# Patient Record
Sex: Male | Born: 1955 | Race: White | Hispanic: No | Marital: Married | Smoking: Never smoker
Health system: Southern US, Community
[De-identification: ages and names within clinical notes are randomized; demographics above are authoritative.]

## PROBLEM LIST (undated history)

## (undated) DIAGNOSIS — R0600 Dyspnea, unspecified: Secondary | ICD-10-CM

## (undated) DIAGNOSIS — M1990 Unspecified osteoarthritis, unspecified site: Secondary | ICD-10-CM

## (undated) DIAGNOSIS — R42 Dizziness and giddiness: Secondary | ICD-10-CM

## (undated) DIAGNOSIS — F419 Anxiety disorder, unspecified: Secondary | ICD-10-CM

## (undated) DIAGNOSIS — G20A1 Parkinson's disease without dyskinesia, without mention of fluctuations: Secondary | ICD-10-CM

## (undated) DIAGNOSIS — K219 Gastro-esophageal reflux disease without esophagitis: Secondary | ICD-10-CM

## (undated) DIAGNOSIS — E7800 Pure hypercholesterolemia, unspecified: Secondary | ICD-10-CM

## (undated) DIAGNOSIS — I10 Essential (primary) hypertension: Secondary | ICD-10-CM

## (undated) HISTORY — DX: Dizziness and giddiness: R42

## (undated) HISTORY — PX: SHOULDER SURGERY: SHX246

## (undated) HISTORY — DX: Essential (primary) hypertension: I10

## (undated) HISTORY — DX: Parkinson's disease without dyskinesia, without mention of fluctuations: G20.A1

## (undated) HISTORY — DX: Anxiety disorder, unspecified: F41.9

## (undated) HISTORY — PX: HERNIA REPAIR: SHX51

## (undated) HISTORY — DX: Pure hypercholesterolemia, unspecified: E78.00

## (undated) HISTORY — DX: Unspecified osteoarthritis, unspecified site: M19.90

---

## 2012-05-15 DIAGNOSIS — F410 Panic disorder [episodic paroxysmal anxiety] without agoraphobia: Secondary | ICD-10-CM | POA: Insufficient documentation

## 2012-05-15 DIAGNOSIS — G4733 Obstructive sleep apnea (adult) (pediatric): Secondary | ICD-10-CM | POA: Insufficient documentation

## 2012-05-15 DIAGNOSIS — E785 Hyperlipidemia, unspecified: Secondary | ICD-10-CM | POA: Insufficient documentation

## 2012-05-15 DIAGNOSIS — E291 Testicular hypofunction: Secondary | ICD-10-CM | POA: Insufficient documentation

## 2012-05-15 DIAGNOSIS — F419 Anxiety disorder, unspecified: Secondary | ICD-10-CM | POA: Insufficient documentation

## 2012-05-15 DIAGNOSIS — G4700 Insomnia, unspecified: Secondary | ICD-10-CM | POA: Insufficient documentation

## 2012-05-15 DIAGNOSIS — Z9889 Other specified postprocedural states: Secondary | ICD-10-CM | POA: Insufficient documentation

## 2017-01-14 HISTORY — PX: BACK SURGERY: SHX140

## 2018-09-01 DIAGNOSIS — E039 Hypothyroidism, unspecified: Secondary | ICD-10-CM | POA: Insufficient documentation

## 2018-09-01 DIAGNOSIS — G8929 Other chronic pain: Secondary | ICD-10-CM | POA: Insufficient documentation

## 2019-12-24 ENCOUNTER — Other Ambulatory Visit (HOSPITAL_COMMUNITY): Payer: Self-pay | Admitting: Neurosurgery

## 2019-12-24 DIAGNOSIS — M4712 Other spondylosis with myelopathy, cervical region: Secondary | ICD-10-CM

## 2020-01-06 ENCOUNTER — Other Ambulatory Visit: Payer: Self-pay

## 2020-01-06 ENCOUNTER — Ambulatory Visit (HOSPITAL_COMMUNITY)
Admission: RE | Admit: 2020-01-06 | Discharge: 2020-01-06 | Disposition: A | Discharge status: Home / Self Care | Payer: Medicare Other | Source: Ambulatory Visit | Attending: Neurosurgery | Admitting: Neurosurgery

## 2020-01-06 DIAGNOSIS — M4712 Other spondylosis with myelopathy, cervical region: Secondary | ICD-10-CM

## 2020-01-06 DIAGNOSIS — M4722 Other spondylosis with radiculopathy, cervical region: Secondary | ICD-10-CM | POA: Diagnosis present

## 2020-01-15 HISTORY — PX: NECK SURGERY: SHX720

## 2020-11-23 ENCOUNTER — Other Ambulatory Visit (HOSPITAL_COMMUNITY): Payer: Self-pay | Admitting: Neurology

## 2020-11-23 ENCOUNTER — Other Ambulatory Visit: Payer: Self-pay | Admitting: Neurology

## 2020-11-23 DIAGNOSIS — R296 Repeated falls: Secondary | ICD-10-CM

## 2020-11-23 DIAGNOSIS — G903 Multi-system degeneration of the autonomic nervous system: Secondary | ICD-10-CM

## 2020-12-06 ENCOUNTER — Ambulatory Visit (HOSPITAL_COMMUNITY)
Admission: RE | Admit: 2020-12-06 | Discharge: 2020-12-06 | Disposition: A | Discharge status: Home / Self Care | Payer: Medicare Other | Source: Ambulatory Visit | Attending: Neurology | Admitting: Neurology

## 2020-12-06 ENCOUNTER — Other Ambulatory Visit: Payer: Self-pay

## 2020-12-06 DIAGNOSIS — R296 Repeated falls: Secondary | ICD-10-CM

## 2020-12-06 DIAGNOSIS — G903 Multi-system degeneration of the autonomic nervous system: Secondary | ICD-10-CM | POA: Diagnosis not present

## 2021-02-05 ENCOUNTER — Other Ambulatory Visit: Payer: Self-pay

## 2021-02-05 ENCOUNTER — Encounter: Payer: Self-pay | Admitting: Urology

## 2021-02-05 ENCOUNTER — Ambulatory Visit (INDEPENDENT_AMBULATORY_CARE_PROVIDER_SITE_OTHER): Payer: Medicare Other | Admitting: Urology

## 2021-02-05 VITALS — BP 125/81 | HR 60 | Ht 69.0 in | Wt 240.8 lb

## 2021-02-05 DIAGNOSIS — N529 Male erectile dysfunction, unspecified: Secondary | ICD-10-CM | POA: Diagnosis not present

## 2021-02-05 MED ORDER — TADALAFIL 5 MG PO TABS: 5.0000 mg | ORAL_TABLET | Freq: Every day | ORAL | 11 refills | Status: DC

## 2021-02-05 MED ORDER — TADALAFIL 20 MG PO TABS: 20.0000 mg | ORAL_TABLET | Freq: Every day | ORAL | 5 refills | Status: DC | PRN

## 2021-02-05 NOTE — Progress Notes (Signed)
02/05/2021 1:20 PM   Alex [name] 18-Jun-1955 [id number]  Referring provider: Elfredia Nevins, MD [address] [place],  [place] [address]  Erectile dysfunction   HPI: Alex [name] is a 17EX here for evaluation of erectile dysfunction. He previously tried sildenafil 100mg  which failed to give him a firm erection. He cannot get a firm erection and cannot maintain an erection. Good libido. He has Parkinsons. He is on TRT and takes 400mg  IM every 2 weeks. Testosterone 500. IPSS 24 QOL 3. Nocturia 1x. He has an intermittent weak stream. PSA 1.6.    PMH: Past Medical History:  Diagnosis Date   Anxiety    Arthritis    High cholesterol    Hypertension     Surgical History:   Home Medications:  Allergies as of 02/05/2021       Reactions   Oxycodone         Medication List        Accurate as of February 05, 2021  1:20 PM. If you have any questions, ask your nurse or doctor.          amantadine 100 MG capsule Commonly known as: SYMMETREL Take 100 mg by mouth 2 (two) times daily.   atorvastatin 40 MG tablet Commonly known as: LIPITOR Take 40 mg by mouth daily.   carbidopa-levodopa 25-100 MG tablet Commonly known as: SINEMET IR Take 1 tablet by mouth 4 (four) times daily.   citalopram 40 MG tablet Commonly known as: CELEXA Take by mouth.   folic acid 1 MG tablet Commonly known as: FOLVITE folic acid 1 mg tablet   gabapentin 300 MG capsule Commonly known as: NEURONTIN Take 300 mg by mouth daily.   Levothyroxine Sodium 25 MCG Caps Take by mouth.   metoprolol succinate 50 MG 24 hr tablet Commonly known as: TOPROL-XL metoprolol succinate ER 50 mg tablet,extended release 24 hr        Allergies:  Allergies  Allergen Reactions   Oxycodone     Family History: No family history on file.  Social History:  has no history on file for tobacco use, alcohol use, and drug use.  ROS: All other review of systems were reviewed and are negative  except what is noted above in HPI  Physical Exam: BP 125/81    Pulse 60    Ht 5\' 9"  (1.753 m)    Wt 240 lb 12.8 oz (109.2 kg)    BMI 35.56 kg/m   Constitutional:  Alert and oriented, No acute distress. HEENT: [place] AT, moist mucus membranes.  Trachea midline, no masses. Cardiovascular: No clubbing, cyanosis, or edema. Respiratory: Normal respiratory effort, no increased work of breathing. GI: Abdomen is soft, nontender, nondistended, no abdominal masses GU: No CVA tenderness.   Lymph: No cervical or inguinal lymphadenopathy. Skin: No rashes, bruises or suspicious lesions. Neurologic: Grossly intact, no focal deficits, moving all 4 extremities. Psychiatric: Normal mood and affect.  Laboratory Data: No results found for: WBC, HGB, HCT, MCV, PLT  No results found for: CREATININE  No results found for: PSA  No results found for: TESTOSTERONE  No results found for: HGBA1C  Urinalysis No results found for: COLORURINE, APPEARANCEUR, LABSPEC, PHURINE, GLUCOSEU, HGBUR, BILIRUBINUR, KETONESUR, PROTEINUR, UROBILINOGEN, NITRITE, LEUKOCYTESUR  No results found for: LABMICR, WBCUA, RBCUA, LABEPIT, MUCUS, BACTERIA  Pertinent Imaging:  No results found for this or any previous visit.  No results found for this or any previous visit.  No results found for this or any previous visit.  No results  found for this or any previous visit.  No results found for this or any previous visit.  No results found for this or any previous visit.  No results found for this or any previous visit.  No results found for this or any previous visit.   Assessment & Plan:    1. Erectile dysfunction, unspecified erectile dysfunction type -We will start tadalafil 5mg  daily - Urinalysis, Routine w reflex microscopic  2. BPH with Weak urinary stream -We will start tadalafil 5mg  daily -RTC 6 weeks  No follow-ups on file.  , MD  [facility] [place]

## 2021-02-05 NOTE — Patient Instructions (Signed)

## 2021-02-05 NOTE — Progress Notes (Signed)
Urological Symptom Review  Patient is experiencing the following symptoms: Stream starts and stops   Review of Systems  Gastrointestinal (upper)  : Negative for upper GI symptoms  Gastrointestinal (lower) : Negative for lower GI symptoms  Constitutional : Negative for symptoms  Skin: Negative for skin symptoms  Eyes: Negative for eye symptoms  Ear/Nose/Throat : Sinus problems  Hematologic/Lymphatic: Negative for Hematologic/Lymphatic symptoms  Cardiovascular : Negative for cardiovascular symptoms  Respiratory : Negative for respiratory symptoms  Endocrine: Negative for endocrine symptoms  Musculoskeletal: Back pain Joint pain  Neurological: Negative for neurological symptoms  Psychologic: Negative for psychiatric symptoms

## 2021-02-16 ENCOUNTER — Inpatient Hospital Stay (HOSPITAL_COMMUNITY): Payer: Medicare Other | Attending: Hematology | Admitting: Hematology

## 2021-02-16 ENCOUNTER — Other Ambulatory Visit: Payer: Self-pay

## 2021-02-16 ENCOUNTER — Inpatient Hospital Stay (HOSPITAL_COMMUNITY): Payer: Medicare Other

## 2021-02-16 ENCOUNTER — Encounter (HOSPITAL_COMMUNITY): Payer: Self-pay | Admitting: Hematology

## 2021-02-16 VITALS — BP 121/88 | HR 68 | Temp 98.3°F | Resp 18 | Ht 68.0 in | Wt 240.9 lb

## 2021-02-16 DIAGNOSIS — D751 Secondary polycythemia: Secondary | ICD-10-CM | POA: Diagnosis present

## 2021-02-16 DIAGNOSIS — G20 Parkinson's disease: Secondary | ICD-10-CM | POA: Insufficient documentation

## 2021-02-16 DIAGNOSIS — R634 Abnormal weight loss: Secondary | ICD-10-CM | POA: Diagnosis not present

## 2021-02-16 DIAGNOSIS — D582 Other hemoglobinopathies: Secondary | ICD-10-CM | POA: Diagnosis not present

## 2021-02-16 DIAGNOSIS — Z79899 Other long term (current) drug therapy: Secondary | ICD-10-CM | POA: Insufficient documentation

## 2021-02-16 LAB — CBC WITH DIFFERENTIAL/PLATELET
Abs Immature Granulocytes: 0.03 10*3/uL (ref 0.00–0.07)
Basophils Absolute: 0 10*3/uL (ref 0.0–0.1)
Basophils Relative: 1 %
Eosinophils Absolute: 0.1 10*3/uL (ref 0.0–0.5)
Eosinophils Relative: 2 %
HCT: 52.5 % — ABNORMAL HIGH (ref 39.0–52.0)
Hemoglobin: 17.5 g/dL — ABNORMAL HIGH (ref 13.0–17.0)
Immature Granulocytes: 0 %
Lymphocytes Relative: 16 %
Lymphs Abs: 1.3 10*3/uL (ref 0.7–4.0)
MCH: 31.3 pg (ref 26.0–34.0)
MCHC: 33.3 g/dL (ref 30.0–36.0)
MCV: 93.8 fL (ref 80.0–100.0)
Monocytes Absolute: 0.5 10*3/uL (ref 0.1–1.0)
Monocytes Relative: 7 %
Neutro Abs: 6 10*3/uL (ref 1.7–7.7)
Neutrophils Relative %: 74 %
Platelets: 239 10*3/uL (ref 150–400)
RBC: 5.6 MIL/uL (ref 4.22–5.81)
RDW: 14.7 % (ref 11.5–15.5)
WBC: 8 10*3/uL (ref 4.0–10.5)
nRBC: 0 % (ref 0.0–0.2)

## 2021-02-16 NOTE — Progress Notes (Signed)
[facility] [address] [place] [address]   CLINIC:  Medical Oncology/Hematology  Patient Care Team: Redmond School, MD as PCP - General (Internal Medicine) Derek Jack, MD as Medical Oncologist (Hematology)  CHIEF COMPLAINTS/PURPOSE OF CONSULTATION:  Evaluation of polycythemia  HISTORY OF PRESENTING ILLNESS:  Alex [name] 66 y.o. male is here because of evaluation of polycythemia, at the request of Dr. Gerarda Fraction.  Today he reports feeling good, and he is accompanied by his wife. He receives Testerone twice weekly for the past 12 years for erectile dysfunction. He reports fatigue. He denies previous history of polycythemia. He lost 30 lbs over the past year due to decreased appetite and difficulty swallowing. He reports history of Parkinson's disease. He reports itching on his feet and legs after taking showers; the tops of his feet will turn purple for about 5 minutes following showers. He denies hematuria and SOB. He denies history of cardiac issues. He reports blurry vision and occasional headaches. He denies history of DVT and PE.   He denies history of smoking. 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 He denies history of polycythemia and hemochromatosis. His paternal grandmother had stomach cancer.    MEDICAL HISTORY:  Past Medical History:  Diagnosis Date   Anxiety    Arthritis    High cholesterol    Hypertension     SURGICAL HISTORY: Past Surgical History:  Procedure Laterality Date   BACK SURGERY  2019   NECK SURGERY  2022   SHOULDER SURGERY      SOCIAL HISTORY: Social History   Socioeconomic History   Marital status: Married    Spouse name: Not on file   Number of children: Not on file   Years of education: Not on file   Highest education level: Not on file  Occupational History   Not on file  Tobacco Use   Smoking status: Not on file   Smokeless tobacco: Not on file  Substance and Sexual Activity   Alcohol use:  Not on file   Drug use: Not on file   Sexual activity: Not on file  Other Topics Concern   Not on file  Social History Narrative   Not on file   Social Determinants of Health   Financial Resource Strain: Not on file  Food Insecurity: Not on file  Transportation Needs: Not on file  Physical Activity: Not on file  Stress: Not on file  Social Connections: Not on file  Intimate Partner Violence: Not on file    FAMILY HISTORY: History reviewed. No pertinent family history.  ALLERGIES:  is allergic to oxycodone.  MEDICATIONS:  Current Outpatient Medications  Medication Sig Dispense Refill   amantadine (SYMMETREL) 100 MG capsule Take 100 mg by mouth 2 (two) times daily.     atorvastatin (LIPITOR) 40 MG tablet Take 40 mg by mouth daily.     carbidopa-levodopa (SINEMET IR) 25-100 MG tablet Take 1 tablet by mouth 4 (four) times daily.     citalopram (CELEXA) 40 MG tablet Take by mouth.     folic acid (FOLVITE) 1 MG tablet folic acid 1 mg tablet     gabapentin (NEURONTIN) 300 MG capsule Take 300 mg by mouth daily.     Levothyroxine Sodium 25 MCG CAPS Take by mouth.     metoprolol succinate (TOPROL-XL) 50 MG 24 hr tablet metoprolol succinate ER 50 mg tablet,extended release 24 hr     tadalafil (CIALIS) 20 MG tablet Take 1 tablet (  20 mg total) by mouth daily as needed. 10 tablet 5   tadalafil (CIALIS) 5 MG tablet Take 1 tablet (5 mg total) by mouth daily. 30 tablet 11   VITAMIN D, CHOLECALCIFEROL, PO Take by mouth.     No current facility-administered medications for this visit.    REVIEW OF SYSTEMS:   Review of Systems  Constitutional:  Positive for fatigue. Negative for appetite change.  HENT:   Positive for trouble swallowing.   Eyes:  Positive for eye problems (blurry).  Genitourinary:  Negative for hematuria.   Neurological:  Positive for dizziness, headaches and numbness.  Psychiatric/Behavioral:  Positive for sleep disturbance. The patient is nervous/anxious.   All other  systems reviewed and are negative.   PHYSICAL EXAMINATION: ECOG PERFORMANCE STATUS: 1 - Symptomatic but completely ambulatory  Vitals:   02/16/21 0937  BP: 121/88  Pulse: 68  Resp: 18  Temp: 98.3 F (36.8 C)  SpO2: 99%   Filed Weights   02/16/21 0937  Weight: 240 lb 14.4 oz (109.3 kg)   Physical Exam Vitals reviewed.  Constitutional:      Appearance: Normal appearance. He is obese.  Cardiovascular:     Rate and Rhythm: Normal rate and regular rhythm.     Pulses: Normal pulses.     Heart sounds: Normal heart sounds.  Pulmonary:     Effort: Pulmonary effort is normal.     Breath sounds: Normal breath sounds.  Abdominal:     Palpations: Abdomen is soft. There is no hepatomegaly, splenomegaly or mass.     Tenderness: There is no abdominal tenderness.  Musculoskeletal:     Right lower leg: No edema.     Left lower leg: No edema.  Lymphadenopathy:     Cervical:     Right cervical: No superficial cervical adenopathy.    Left cervical: No superficial cervical adenopathy.     Upper Body:     Right upper body: No supraclavicular adenopathy.     Left upper body: No supraclavicular adenopathy.     Lower Body: No right inguinal adenopathy. No left inguinal adenopathy.  Neurological:     General: No focal deficit present.     Mental Status: He is alert and oriented to person, place, and time.  Psychiatric:        Mood and Affect: Mood normal.        Behavior: Behavior normal.     LABORATORY DATA:  I have reviewed the data as listed No results found for this or any previous visit (from the past 2160 hour(s)).  RADIOGRAPHIC STUDIES: I have personally reviewed the radiological images as listed and agreed with the findings in the report. No results found.  ASSESSMENT:  Erythrocytosis: - Patient seen at the request of Dr. Gerarda Fraction.  CBC on 01/27/2021 showed hemoglobin 17.9 (13-17 0.7), hematocrit 53.1 (37.5-51) and RBC 5.91 (4.14-5.8). - He has been on testosterone injections  400 mg every 2 weeks for the past 12 years on the same dose. - He had 23 pound weight loss in the last 1 year, due to trouble swallowing from Parkinson's disease.  He is on treatment for Parkinson's disease. - He reports his feet and legs itching after showers.  Feet sometimes turn purple and last about 5 minutes.   Social/family history: - He is a retired Administrator.  No smoking history. - No family history of polycythemia.  Paternal grandmother had stomach cancer.   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 -  However he has been on testosterone injections for the past 12 years on the same dose. - Hence I have recommended repeating CBC.  If erythrocytosis is confirmed, will also check for myeloproliferative disorders with JAK2 mutation testing as well as EPO levels. - RTC 2 weeks for follow-up.   All questions were answered. The patient knows to call the clinic with any problems, questions or concerns.  Derek Jack, MD 02/16/21 10:02 AM  [place] [phone]   I, Thana Ates, am acting as a scribe for Dr. Derek Jack.  I, Derek Jack MD, have reviewed the above documentation for accuracy and completeness, and I agree with the above.

## 2021-02-16 NOTE — Patient Instructions (Addendum)
[place] at [facility] Discharge Instructions  You were seen and examined today by Dr. Delton Coombes. Dr. Delton Coombes is a hematologist, meaning that he specializes in blood abnormalities. Dr. Delton Coombes discussed your past medical history, family history of cancers/blood conditions and the events that led to you being here today.  You were referred to Dr. Delton Coombes due to your elevated hemoglobin and hematocrit. Dr. Delton Coombes has recommended additional lab work today in an attempt to identify the cause of your elevated hemoglobin.  Follow-up as scheduled.   Thank you for choosing [place] at [facility] to provide your oncology and hematology care.  To afford each patient quality time with our provider, please arrive at least 15 minutes before your scheduled appointment time.   If you have a lab appointment with the [facility] please come in thru the Main Entrance and check in at the main information desk.  You need to re-schedule your appointment should you arrive 10 or more minutes late.  We strive to give you quality time with our providers, and arriving late affects you and other patients whose appointments are after yours.  Also, if you no show three or more times for appointments you may be dismissed from the clinic at the providers discretion.     Again, thank you for choosing [facility].  Our hope is that these requests will decrease the amount of time that you wait before being seen by our physicians.       _____________________________________________________________  Should you have questions after your visit to [facility], please contact our office at [phone] and follow the prompts.  Our office hours are 8:00 a.m. and 4:30 p.m. Monday - Friday.  Please note that voicemails left after 4:00 p.m. may not be returned until the following business day.  We are closed weekends and major holidays.  You do  have access to a nurse 24-7, just call the main number to the clinic [phone] and do not press any options, hold on the line and a nurse will answer the phone.    For prescription refill requests, have your pharmacy contact our office and allow 72 hours.    Due to Covid, you will need to wear a mask upon entering the hospital. If you do not have a mask, a mask will be given to you at the Main Entrance upon arrival. For doctor visits, patients may have 1 support person age 16 or older with them. For treatment visits, patients can not have anyone with them due to social distancing guidelines and our immunocompromised population.

## 2021-02-17 LAB — ERYTHROPOIETIN: Erythropoietin: 20 m[IU]/mL — ABNORMAL HIGH (ref 2.6–18.5)

## 2021-02-19 ENCOUNTER — Ambulatory Visit: Payer: Medicare Other | Admitting: Urology

## 2021-02-27 LAB — CALR + JAK2 E12-15 + MPL (REFLEXED)

## 2021-02-27 LAB — JAK2 V617F, W REFLEX TO CALR/E12/MPL

## 2021-03-06 NOTE — Progress Notes (Signed)
[facility], [place] [address]   CLINIC:  Medical Oncology/Hematology  PCP:  Redmond School, [place] [id number] [phone]   REASON FOR VISIT:  Follow-up for secondary polycythemia  PRIOR THERAPY: None  CURRENT THERAPY: Observation  INTERVAL HISTORY:  Alex [name] 66 y.o. male returns for routine follow-up of secondary polycythemia.  He was seen by Dr. Delton Coombes for initial visit on 02/16/2021.  At today's visit, he reports feeling fairly well.  No recent hospitalizations, surgeries, or changes in baseline health status.  He reports that after his visit with Dr. Raliegh Ip on 02/16/2018, he stopped taking testosterone supplements to see if this would improve his blood counts.  However, he has been feeling much more fatigued since that time.  He continues to endorse aquagenic pruritus with itching in his feet and legs after showers as well as feet turning purple for about 5 minutes.  He also has blurry vision with intermittent headaches and lightheadedness.  He denies any strokelike symptoms, tinnitus, or erythromelalgia.  He has 40% energy and 60% appetite. He endorses that he is maintaining a stable weight.   REVIEW OF SYSTEMS:  Review of Systems  Constitutional:  Positive for fatigue. Negative for appetite change, chills, diaphoresis, fever and unexpected weight change.  HENT:   Positive for trouble swallowing. Negative for lump/mass and nosebleeds.   Eyes:  Negative for eye problems.  Respiratory:  Negative for cough, hemoptysis and shortness of breath.   Cardiovascular:  Negative for chest pain, leg swelling and palpitations.  Gastrointestinal:  Negative for abdominal pain, blood in stool, constipation, diarrhea, nausea and vomiting.  Genitourinary:  Negative for hematuria.   Musculoskeletal:  Positive for arthralgias.  Skin: Negative.   Neurological:  Positive for dizziness. Negative for headaches and light-headedness.   Hematological:  Does not bruise/bleed easily.  Psychiatric/Behavioral:  The patient is nervous/anxious.      PAST MEDICAL/SURGICAL HISTORY:  Past Medical History:  Diagnosis Date   Anxiety    Arthritis    High cholesterol    Hypertension    Past Surgical History:  Procedure Laterality Date   BACK SURGERY  2019   NECK SURGERY  2022   SHOULDER SURGERY       SOCIAL HISTORY:  Social History   Socioeconomic History   Marital status: Married    Spouse name: Not on file   Number of children: Not on file   Years of education: Not on file   Highest education level: Not on file  Occupational History   Not on file  Tobacco Use   Smoking status: Not on file   Smokeless tobacco: Not on file  Substance and Sexual Activity   Alcohol use: Not on file   Drug use: Not on file   Sexual activity: Not on file  Other Topics Concern   Not on file  Social History Narrative   Not on file   Social Determinants of Health   Financial Resource Strain: Not on file  Food Insecurity: Not on file  Transportation Needs: Not on file  Physical Activity: Not on file  Stress: Not on file  Social Connections: Not on file  Intimate Partner Violence: Not on file    FAMILY HISTORY:  No family history on file.  CURRENT MEDICATIONS:  Outpatient Encounter Medications as of 03/07/2021  Medication Sig   amantadine (SYMMETREL) 100 MG capsule Take 100 mg by mouth 2 (two) times daily.   atorvastatin (LIPITOR) 40 MG  tablet Take 40 mg by mouth daily.   carbidopa-levodopa (SINEMET IR) 25-100 MG tablet Take 1 tablet by mouth 4 (four) times daily.   citalopram (CELEXA) 40 MG tablet Take by mouth.   folic acid (FOLVITE) 1 MG tablet folic acid 1 mg tablet   gabapentin (NEURONTIN) 300 MG capsule Take 300 mg by mouth daily.   Levothyroxine Sodium 25 MCG CAPS Take by mouth.   metoprolol succinate (TOPROL-XL) 50 MG 24 hr tablet metoprolol succinate ER 50 mg tablet,extended release 24 hr   tadalafil (CIALIS)  20 MG tablet Take 1 tablet (20 mg total) by mouth daily as needed.   tadalafil (CIALIS) 5 MG tablet Take 1 tablet (5 mg total) by mouth daily.   VITAMIN D, CHOLECALCIFEROL, PO Take by mouth.   No facility-administered encounter medications on file as of 03/07/2021.    ALLERGIES:  Allergies  Allergen Reactions   Oxycodone      PHYSICAL EXAM:  ECOG PERFORMANCE STATUS: 1 - Symptomatic but completely ambulatory  There were no vitals filed for this visit. There were no vitals filed for this visit. Physical Exam Constitutional:      Appearance: Normal appearance. He is obese.  HENT:     Head: Normocephalic and atraumatic.     Mouth/Throat:     Mouth: Mucous membranes are moist.  Eyes:     Extraocular Movements: Extraocular movements intact.     Pupils: Pupils are equal, round, and reactive to light.  Cardiovascular:     Rate and Rhythm: Regular rhythm. Bradycardia present.     Pulses: Normal pulses.     Heart sounds: Normal heart sounds.  Pulmonary:     Effort: Pulmonary effort is normal.     Breath sounds: Normal breath sounds.  Abdominal:     General: Bowel sounds are normal.     Palpations: Abdomen is soft.     Tenderness: There is no abdominal tenderness.  Musculoskeletal:        General: No swelling.     Right lower leg: No edema.     Left lower leg: No edema.  Lymphadenopathy:     Cervical: No cervical adenopathy.  Skin:    General: Skin is warm and dry.  Neurological:     General: No focal deficit present.     Mental Status: He is alert and oriented to person, place, and time.  Psychiatric:        Mood and Affect: Mood normal.        Behavior: Behavior normal.     LABORATORY DATA:  I have reviewed the labs as listed.  CBC    Component Value Date/Time   WBC 8.0 02/16/2021 1018   RBC 5.60 02/16/2021 1018   HGB 17.5 (H) 02/16/2021 1018   HCT 52.5 (H) 02/16/2021 1018   PLT 239 02/16/2021 1018   MCV 93.8 02/16/2021 1018   MCH 31.3 02/16/2021 1018   MCHC  33.3 02/16/2021 1018   RDW 14.7 02/16/2021 1018   LYMPHSABS 1.3 02/16/2021 1018   MONOABS 0.5 02/16/2021 1018   EOSABS 0.1 02/16/2021 1018   BASOSABS 0.0 02/16/2021 1018   No flowsheet data found.  DIAGNOSTIC IMAGING:  I have independently reviewed the relevant imaging and discussed with the patient.  ASSESSMENT & PLAN: 1.  Secondary polycythemia  - Seen at the request of Dr. Gerarda Fraction.  Labs sent from PCP on 01/27/2021 showed Hgb 17.9, HCT 53.1, and RBC 5.91 - Receives testosterone 400 mg every 2 weeks x 12 years -  He is a lifelong non-smoker. - No family history of polycythemia or hemochromatosis. - MPN work-up negative (no JAK2, CALR, or MPL mutations identified) - Erythropoietin mildly elevated at 20.0 (02/16/2021) - Symptomatic with fatigue, aquagenic pruritus, blurry vision, and occasional headaches.  Feet sometimes turn purple after showers, lasts for about 5 minutes. - Discussed that indications for phlebotomy is indicated for secondary polycythemia in cases of severe symptoms (strokelike symptoms, recurrent headaches, severe fatigue) or if HCT is greater than 54 - Differential diagnosis favors secondary polycythemia secondary to testosterone supplementation - PLAN: Recommend therapeutic phlebotomy or voluntary blood donation once every 2 months due to high burden of symptoms.   - Recommend aspirin 81 mg daily due to erythrocytosis in the setting of other cardiac risk factors (hypertension, hyperlipidemia) - Due to elevated erythropoietin in the setting of polycythemia, we will check [place] of kidneys.  We will call patient with results. - Repeat CBC and RTC in 4 months.  2.  Weight loss - Patient had 20 to 30 pound weight loss in the past year due to trouble swallowing from Parkinson's disease.  He is on treatment for Parkinson's disease.  3.  Social/family history: - He is a retired Administrator.  No smoking history. - No family history of polycythemia.  Paternal grandmother had  stomach cancer.     PLAN SUMMARY & DISPOSITION: CBC + phlebotomy every 2 months Renal ultrasound -- CALL WITH RESULTS Labs and RTC in 4 months  All questions were answered. The patient knows to call the clinic with any problems, questions or concerns.  Medical decision making: Moderate  Time spent on visit: I spent 20 minutes counseling the patient face to face. The total time spent in the appointment was 30 minutes and more than 50% was on counseling.   Alex Rush, PA-C  03/07/2021 10:51 AM

## 2021-03-07 ENCOUNTER — Other Ambulatory Visit: Payer: Self-pay

## 2021-03-07 ENCOUNTER — Encounter (HOSPITAL_COMMUNITY): Payer: Self-pay | Admitting: Physician Assistant

## 2021-03-07 ENCOUNTER — Inpatient Hospital Stay (HOSPITAL_COMMUNITY): Payer: Medicare Other | Admitting: Physician Assistant

## 2021-03-07 VITALS — BP 148/95 | HR 51 | Temp 98.1°F | Resp 18 | Ht 67.13 in | Wt 237.0 lb

## 2021-03-07 DIAGNOSIS — D751 Secondary polycythemia: Secondary | ICD-10-CM | POA: Diagnosis not present

## 2021-03-07 HISTORY — DX: Secondary polycythemia: D75.1

## 2021-03-07 MED ORDER — ASPIRIN EC 81 MG PO TBEC: 81.0000 mg | DELAYED_RELEASE_TABLET | Freq: Every day | ORAL | 3 refills | Status: DC

## 2021-03-07 NOTE — Patient Instructions (Addendum)
Alex [name] at [facility] Discharge Instructions  You were seen today by Tarri Abernethy PA-C for your elevated blood counts.  The labs we checked were negative for any signs of blood cancer.  We suspect your elevated hemoglobin/red blood cells is due to your testosterone supplementation.  You can still continue to receive testosterone supplements, since we will be treating your elevated red blood cells with phlebotomy (see below).  We will however check an ultrasound of your kidneys to make sure you do not have any lesions on your kidneys that would be causing your body to make extra blood cells.  Due to your symptoms, I recommend that you donate blood once every 2 months to help decrease the amount of extra red blood cells in your body.  We will set you up to have this blood donation procedure (called PHLEBOTOMY) done here at our office.  We will check your blood counts on the same day as phlebotomy before any blood is taken.  You should also start taking aspirin 81 mg daily to reduce your risk of heart attack, stroke, and blood clot.  We will check your labs and see you for follow-up visit in 4 months.    Thank you for choosing [facility] to provide your oncology and hematology care.  To afford each patient quality time with our provider, please arrive at least 15 minutes before your scheduled appointment time.   If you have a lab appointment with the [facility] please come in thru the Main Entrance and check in at the main information desk.  You need to re-schedule your appointment should you arrive 10 or more minutes late.  We strive to give you quality time with our providers, and arriving late affects you and other patients whose appointments are after yours.  Also, if you no show three or more times for appointments you may be dismissed from the clinic at the providers discretion.     Again, thank you for choosing  [facility].  Our hope is that these requests will decrease the amount of time that you wait before being seen by our physicians.       _____________________________________________________________  Should you have questions after your visit to [facility], please contact our office at [phone] and follow the prompts.  Our office hours are 8:00 a.m. and 4:30 p.m. Monday - Friday.  Please note that voicemails left after 4:00 p.m. may not be returned until the following business day.  We are closed weekends and major holidays.  You do have access to a nurse 24-7, just call the main number to the clinic [phone] and do not press any options, hold on the line and a nurse will answer the phone.    For prescription refill requests, have your pharmacy contact our office and allow 72 hours.    Due to Covid, you will need to wear a mask upon entering the hospital. If you do not have a mask, a mask will be given to you at the Main Entrance upon arrival. For doctor visits, patients may have 1 support person age 69 or older with them. For treatment visits, patients can not have anyone with them due to social distancing guidelines and our immunocompromised population.

## 2021-03-08 ENCOUNTER — Inpatient Hospital Stay (HOSPITAL_COMMUNITY): Payer: Medicare Other

## 2021-03-08 DIAGNOSIS — D751 Secondary polycythemia: Secondary | ICD-10-CM | POA: Diagnosis not present

## 2021-03-08 LAB — CBC
HCT: 52.8 % — ABNORMAL HIGH (ref 39.0–52.0)
Hemoglobin: 17.1 g/dL — ABNORMAL HIGH (ref 13.0–17.0)
MCH: 30 pg (ref 26.0–34.0)
MCHC: 32.4 g/dL (ref 30.0–36.0)
MCV: 92.6 fL (ref 80.0–100.0)
Platelets: 229 10*3/uL (ref 150–400)
RBC: 5.7 MIL/uL (ref 4.22–5.81)
RDW: 14 % (ref 11.5–15.5)
WBC: 8 10*3/uL (ref 4.0–10.5)
nRBC: 0 % (ref 0.0–0.2)

## 2021-03-08 NOTE — Progress Notes (Signed)
Alex [name] presents today for phlebotomy per MD orders. Phlebotomy procedure started at 1425 and ended at 1431. 500 cc removed. Patient tolerated procedure well. IV needle removed intact.  Reviewed side effects of phlebotomy with all questions asked and answered.    Coban dressing applied.  Patient drinking cola.  VSS with discharge and left in satisfactory condition.

## 2021-03-08 NOTE — Patient Instructions (Signed)

## 2021-03-15 ENCOUNTER — Ambulatory Visit (HOSPITAL_COMMUNITY)
Admission: RE | Admit: 2021-03-15 | Discharge: 2021-03-15 | Disposition: A | Discharge status: Home / Self Care | Payer: Medicare Other | Source: Ambulatory Visit | Attending: Physician Assistant | Admitting: Physician Assistant

## 2021-03-15 ENCOUNTER — Other Ambulatory Visit: Payer: Self-pay

## 2021-03-15 DIAGNOSIS — D751 Secondary polycythemia: Secondary | ICD-10-CM | POA: Insufficient documentation

## 2021-03-19 ENCOUNTER — Telehealth: Payer: Self-pay

## 2021-03-19 NOTE — Telephone Encounter (Signed)
Patient called and wanted to know if medication listed below dosage could be increased to 10 mg instead of 5 mg. Patient advised he is not seeing any changes taking the 5 mg dose. ? ? ?Medication: ?tadalafil (CIALIS) 5 MG tablet ? ?Pharmacy: ?[id number] - [place], [place] - [address] ?

## 2021-03-20 NOTE — Progress Notes (Signed)
Patient called.  Left message for patient to call back.

## 2021-03-20 NOTE — Telephone Encounter (Signed)
Could you look at this for Dr. Ronne Binning in his absence. ?

## 2021-03-20 NOTE — Progress Notes (Signed)
Patient called.  Patient aware and verbalized understanding.

## 2021-03-20 NOTE — Telephone Encounter (Signed)
Patient called back to check on the amount being increased for the  ? ?tadalafil (CIALIS) 5 MG tablet ? ?Patient was informed Dr. Ronne Binning is not in office.  He will be back on Mon. 03-26-2021. ? ?Thanks, ?Rosey Bath ?

## 2021-03-22 NOTE — Telephone Encounter (Signed)
Patient called and notified.

## 2021-04-06 ENCOUNTER — Other Ambulatory Visit: Payer: Self-pay

## 2021-04-06 ENCOUNTER — Ambulatory Visit: Payer: Medicare Other | Admitting: Urology

## 2021-04-06 ENCOUNTER — Encounter: Payer: Self-pay | Admitting: Urology

## 2021-04-06 VITALS — BP 118/76 | HR 74

## 2021-04-06 DIAGNOSIS — N529 Male erectile dysfunction, unspecified: Secondary | ICD-10-CM

## 2021-04-06 MED ORDER — TADALAFIL 5 MG PO TABS: 5.0000 mg | ORAL_TABLET | Freq: Every day | ORAL | 11 refills | Status: DC

## 2021-04-06 NOTE — Patient Instructions (Signed)
Erectile Dysfunction °Erectile dysfunction (ED) is the inability to get or keep an erection in order to have sexual intercourse. ED is considered a symptom of an underlying disorder and is not considered a disease. ED may include: °Inability to get an erection. °Lack of enough hardness of the erection to allow penetration. °Loss of erection before sex is finished. °What are the causes? °This condition may be caused by: °Physical causes, such as: °Artery problems. This may include heart disease, high blood pressure, atherosclerosis, and diabetes. °Hormonal problems, such as low testosterone. °Obesity. °Nerve problems. This may include back or pelvic injuries, multiple sclerosis, Parkinson's disease, spinal cord injury, and stroke. °Certain medicines, such as: °Pain relievers. °Antidepressants. °Blood pressure medicines and water pills (diuretics). °Cancer medicines. °Antihistamines. °Muscle relaxants. °Lifestyle factors, such as: °Use of drugs such as marijuana, cocaine, or opioids. °Excessive use of alcohol. °Smoking. °Lack of physical activity or exercise. °Psychological causes, such as: °Anxiety or stress. °Sadness or depression. °Exhaustion. °Fear about sexual performance. °Guilt. °What are the signs or symptoms? °Symptoms of this condition include: °Inability to get an erection. °Lack of enough hardness of the erection to allow penetration. °Loss of the erection before sex is finished. °Sometimes having normal erections, but with frequent unsatisfactory episodes. °Low sexual satisfaction in either partner due to erection problems. °A curved penis occurring with erection. The curve may cause pain, or the penis may be too curved to allow for intercourse. °Never having nighttime or morning erections. °How is this diagnosed? °This condition is often diagnosed by: °Performing a physical exam to find other diseases or specific problems with the penis. °Asking you detailed questions about the problem. °Doing tests,  such as: °Blood tests to check for diabetes mellitus or high cholesterol, or to measure hormone levels. °Other tests to check for underlying health conditions. °An ultrasound exam to check for scarring. °A test to check blood flow to the penis. °Doing a sleep study at home to measure nighttime erections. °How is this treated? °This condition may be treated by: °Medicines, such as: °Medicine taken by mouth to help you achieve an erection (oral medicine). °Hormone replacement therapy to replace low testosterone levels. °Medicine that is injected into the penis. Your health care provider may instruct you how to give yourself these injections at home. °Medicine that is delivered with a short applicator tube. The tube is inserted into the opening at the tip of the penis, which is the opening of the urethra. A tiny pellet of medicine is put in the urethra. The pellet dissolves and enhances erectile function. This is also called MUSE (medicated urethral system for erections) therapy. °Vacuum pump. This is a pump with a ring on it. The pump and ring are placed on the penis and used to create pressure that helps the penis become erect. °Penile implant surgery. In this procedure, you may receive: °An inflatable implant. This consists of cylinders, a pump, and a reservoir. The cylinders can be inflated with a fluid that helps to create an erection, and they can be deflated after intercourse. °A semi-rigid implant. This consists of two silicone rubber rods. The rods provide some rigidity. They are also flexible, so the penis can both curve downward in its normal position and become straight for sexual intercourse. °Blood vessel surgery to improve blood flow to the penis. During this procedure, a blood vessel from a different part of the body is placed into the penis to allow blood to flow around (bypass) damaged or blocked blood vessels. °Lifestyle changes,   such as exercising more, losing weight, and quitting smoking. °Follow  these instructions at home: °Medicines ° °Take over-the-counter and prescription medicines only as told by your health care provider. Do not increase the dosage without first discussing it with your health care provider. °If you are using self-injections, do injections as directed by your health care provider. Make sure you avoid any veins that are on the surface of the penis. After giving an injection, apply pressure to the injection site for 5 minutes. °Talk to your health care provider about how to prevent headaches while taking ED medicines. These medicines may cause a sudden headache due to the increase in blood flow in your body. °General instructions °Exercise regularly, as directed by your health care provider. Work with your health care provider to lose weight, if needed. °Do not use any products that contain nicotine or tobacco. These products include cigarettes, chewing tobacco, and vaping devices, such as e-cigarettes. If you need help quitting, ask your health care provider. °Before using a vacuum pump, read the instructions that come with the pump and discuss any questions with your health care provider. °Keep all follow-up visits. This is important. °Contact a health care provider if: °You feel nauseous. °You are vomiting. °You get sudden headaches while taking ED medicines. °You have any concerns about your sexual health. °Get help right away if: °You are taking oral or injectable medicines and you have an erection that lasts longer than 4 hours. If your health care provider is unavailable, go to the nearest emergency room for evaluation. An erection that lasts much longer than 4 hours can result in permanent damage to your penis. °You have severe pain in your groin or abdomen. °You develop redness or severe swelling of your penis. °You have redness spreading at your groin or lower abdomen. °You are unable to urinate. °You experience chest pain or a rapid heartbeat (palpitations) after taking oral  medicines. °These symptoms may represent a serious problem that is an emergency. Do not wait to see if the symptoms will go away. Get medical help right away. Call your local emergency services (911 in the [place] Do not drive yourself to the hospital. °Summary °Erectile dysfunction (ED) is the inability to get or keep an erection during sexual intercourse. °This condition is diagnosed based on a physical exam, your symptoms, and tests to determine the cause. Treatment varies depending on the cause and may include medicines, hormone therapy, surgery, or a vacuum pump. °You may need follow-up visits to make sure that you are using your medicines or devices correctly. °Get help right away if you are taking or injecting medicines and you have an erection that lasts longer than 4 hours. °This information is not intended to replace advice given to you by your health care provider. Make sure you discuss any questions you have with your health care provider. °Document Revised: 03/29/2020 Document Reviewed: 03/29/2020 °Elsevier Patient Education © 2022 [employer] ° °

## 2021-04-06 NOTE — Progress Notes (Signed)
? ?04/06/2021 ?10:10 AM  ? ?Alex [name] ?[birth year] ?[id number] ? ?Referring provider: Redmond School, MD ?[address] ?[place],  [place] [address] ? ?Followup erectile dysfunction ? ? ?HPI: ? ?Mr Alex [name] is a [id number] here for for followup for erectile dysfunction. He found that taking tadalfil at night gave him an firm erection in the morning. He has good libido. Good exercise tolerance. No other complaints today ? ?PMH: ?Past Medical History:  ?Diagnosis Date  ? Anxiety   ? Arthritis   ? High cholesterol   ? Hypertension   ? 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 ? ?Surgical History: ?Past Surgical History:  ?Procedure Laterality Date  ? BACK SURGERY  2019  ? NECK SURGERY  2022  ? SHOULDER SURGERY    ? ? ?Home Medications:  ?Allergies as of 04/06/2021   ? ?   Reactions  ? Oxycodone   ? ?  ? ?  ?Medication List  ?  ? ?  ? Accurate as of April 06, 2021 10:10 AM. If you have any questions, ask your nurse or doctor.  ?  ?  ? ?  ? ?amantadine 100 MG capsule ?Commonly known as: SYMMETREL ?Take 100 mg by mouth 2 (two) times daily. ?  ?aspirin EC 81 MG tablet ?Take 1 tablet (81 mg total) by mouth daily. Swallow whole. ?  ?atorvastatin 40 MG tablet ?Commonly known as: LIPITOR ?Take 40 mg by mouth daily. ?  ?carbidopa-levodopa 25-100 MG tablet ?Commonly known as: SINEMET IR ?Take 1 tablet by mouth 4 (four) times daily. ?  ?citalopram 40 MG tablet ?Commonly known as: CELEXA ?Take by mouth. ?  ?folic acid 1 MG tablet ?Commonly known as: FOLVITE ?folic acid 1 mg tablet ?  ?gabapentin 300 MG capsule ?Commonly known as: NEURONTIN ?Take 300 mg by mouth daily. ?  ?Levothyroxine Sodium 25 MCG Caps ?Take by mouth. ?  ?levothyroxine 25 MCG tablet ?Commonly known as: SYNTHROID ?Take 25 mcg by mouth daily. ?  ?metoprolol succinate 50 MG 24 hr tablet ?Commonly known as: TOPROL-XL ?metoprolol succinate ER 50 mg tablet,extended release 24 hr ?  ?tadalafil 20 MG tablet ?Commonly known as: CIALIS ?Take 1 tablet (20 mg total) by mouth  daily as needed. ?  ?tadalafil 5 MG tablet ?Commonly known as: CIALIS ?Take 1 tablet (5 mg total) by mouth daily. ?  ?VITAMIN D (CHOLECALCIFEROL) PO ?Take by mouth. ?  ? ?  ? ? ?Allergies:  ?Allergies  ?Allergen Reactions  ? Oxycodone   ? ? ?Family History: ?No family history on file. ? ?Social History:  has no history on file for tobacco use, alcohol use, and drug use. ? ?ROS: ?All other review of systems were reviewed and are negative except what is noted above in HPI ? ?Physical Exam: ?BP 118/76   Pulse 74   ?Constitutional:  Alert and oriented, No acute distress. ?HEENT: [place] AT, moist mucus membranes.  Trachea midline, no masses. ?Cardiovascular: No clubbing, cyanosis, or edema. ?Respiratory: Normal respiratory effort, no increased work of breathing. ?GI: Abdomen is soft, nontender, nondistended, no abdominal masses ?GU: No CVA tenderness.  ?Lymph: No cervical or inguinal lymphadenopathy. ?Skin: No rashes, bruises or suspicious lesions. ?Neurologic: Grossly intact, no focal deficits, moving all 4 extremities. ?Psychiatric: Normal mood and affect. ? ?Laboratory Data: ?Lab Results  ?Component Value Date  ? WBC 8.0 03/08/2021  ? HGB 17.1 (H) 03/08/2021  ? HCT 52.8 (H) 03/08/2021  ? MCV 92.6 03/08/2021  ? PLT 229 03/08/2021  ? ? ?No results found for:  CREATININE ? ?No results found for: PSA ? ?No results found for: TESTOSTERONE ? ?No results found for: HGBA1C ? ?Urinalysis ?No results found for: COLORURINE, APPEARANCEUR, [place], [place], [place], [place], [place], KETONESUR, PROTEINUR, [place], NITRITE, LEUKOCYTESUR ? ?No results found for: LABMICR, [place], RBCUA, LABEPIT, MUCUS, BACTERIA ? ?Pertinent Imaging: ? ?No results found for this or any previous visit. ? ?No results found for this or any previous visit. ? ?No results found for this or any previous visit. ? ?No results found for this or any previous visit. ? ?Results for orders placed during the hospital encounter of 03/15/21 ? ?US  RENAL ? ?Narrative ?CLINICAL DATA:  Polycythemia and elevated erythropoietin. ? ?EXAM: ?RENAL / URINARY TRACT ULTRASOUND COMPLETE ? ?COMPARISON:  None. ? ?FINDINGS: ?Right Kidney: ? ?Renal measurements: 10.5 cm x 6.2 cm x 4.9 cm = volume: 167.2 mL. ?Echogenicity within normal limits. No mass or hydronephrosis ?visualized. ? ?Left Kidney: ? ?Renal measurements: 11.1 cm x 5.5 cm x 5.6 cm = volume: 179.0 mL. ?Echogenicity within normal limits. No mass or hydronephrosis ?visualized. ? ?Bladder: ? ?Appears normal for degree of bladder distention. Bilateral ureteral ?jets are visualized. ? ?Other: ? ?None. ? ?IMPRESSION: ?Normal renal ultrasound. ? ? ?Electronically Signed ?By: Virgina Norfolk M.D. ?On: 03/16/2021 20:43 ? ?No results found for this or any previous visit. ? ?No results found for this or any previous visit. ? ?No results found for this or any previous visit. ? ? ?Assessment & Plan:   ? ?1. Erectile dysfunction, unspecified erectile dysfunction type ?-Switch tadalafil 5mg  in Am and 20mg  prn ? ? ?No follow-ups on file. ? ?Alex Bang, MD ? ?[facility] ?  ?

## 2021-05-03 ENCOUNTER — Inpatient Hospital Stay (HOSPITAL_COMMUNITY): Payer: Medicare Other

## 2021-05-03 ENCOUNTER — Encounter (HOSPITAL_COMMUNITY): Payer: Self-pay

## 2021-05-03 ENCOUNTER — Inpatient Hospital Stay (HOSPITAL_COMMUNITY): Payer: Medicare Other | Attending: Hematology

## 2021-05-03 DIAGNOSIS — D751 Secondary polycythemia: Secondary | ICD-10-CM | POA: Diagnosis not present

## 2021-05-03 LAB — CBC
HCT: 50.8 % (ref 39.0–52.0)
Hemoglobin: 16.6 g/dL (ref 13.0–17.0)
MCH: 30.1 pg (ref 26.0–34.0)
MCHC: 32.7 g/dL (ref 30.0–36.0)
MCV: 92 fL (ref 80.0–100.0)
Platelets: 229 10*3/uL (ref 150–400)
RBC: 5.52 MIL/uL (ref 4.22–5.81)
RDW: 13.2 % (ref 11.5–15.5)
WBC: 6 10*3/uL (ref 4.0–10.5)
nRBC: 0 % (ref 0.0–0.2)

## 2021-05-03 NOTE — Progress Notes (Signed)
Patient presents today for phlebotomy per MD orders.  ?Procedure started at 1400, 250cc removed, unable to remove anymore blood at 1410. Additional phlebotomy, at 1438 and ended at 1440, 250cc removed, patient tolerated procedure well, with no complaints voiced. Patient provided with a cola and discharged in satisfactory condition.  ? ?

## 2021-05-03 NOTE — Patient Instructions (Signed)
[facility]  Discharge Instructions: ?Thank you for choosing [facility] to provide your oncology and hematology care.  ?If you have a lab appointment with the Cancer Center, please come in thru the Main Entrance and check in at the main information desk. ? ?Wear comfortable clothing and clothing appropriate for easy access to any Portacath or PICC line.  ? ?We strive to give you quality time with your provider. You may need to reschedule your appointment if you arrive late (15 or more minutes).  Arriving late affects you and other patients whose appointments are after yours.  Also, if you miss three or more appointments without notifying the office, you may be dismissed from the clinic at the provider?s discretion.    ?  ?For prescription refill requests, have your pharmacy contact our office and allow 72 hours for refills to be completed.   ? ?Today a phlebotomy was performed, return as scheduled. ?  ?To help prevent nausea and vomiting after your treatment, we encourage you to take your nausea medication as directed. ? ?BELOW ARE SYMPTOMS THAT SHOULD BE REPORTED IMMEDIATELY: ?*FEVER GREATER THAN 100.4 F (38 ?C) OR HIGHER ?*CHILLS OR SWEATING ?*NAUSEA AND VOMITING THAT IS NOT CONTROLLED WITH YOUR NAUSEA MEDICATION ?*UNUSUAL SHORTNESS OF BREATH ?*UNUSUAL BRUISING OR BLEEDING ?*URINARY PROBLEMS (pain or burning when urinating, or frequent urination) ?*BOWEL PROBLEMS (unusual diarrhea, constipation, pain near the anus) ?TENDERNESS IN MOUTH AND THROAT WITH OR WITHOUT PRESENCE OF ULCERS (sore throat, sores in mouth, or a toothache) ?UNUSUAL RASH, SWELLING OR PAIN  ?UNUSUAL VAGINAL DISCHARGE OR ITCHING  ? ?Items with * indicate a potential emergency and should be followed up as soon as possible or go to the Emergency Department if any problems should occur. ? ?Please show the CHEMOTHERAPY ALERT CARD or IMMUNOTHERAPY ALERT CARD at check-in to the Emergency Department and triage nurse. ? ?Should  you have questions after your visit or need to cancel or reschedule your appointment, please contact [facility] [phone]  and follow the prompts.  Office hours are 8:00 a.m. to 4:30 p.m. Monday - Friday. Please note that voicemails left after 4:00 p.m. may not be returned until the following business day.  We are closed weekends and major holidays. You have access to a nurse at all times for urgent questions. Please call the main number to the clinic [phone] and follow the prompts. ? ?For any non-urgent questions, you may also contact your provider using MyChart. We now offer e-Visits for anyone 18 and older to request care online for non-urgent symptoms. For details visit [email or web address]. ?  ?Also download the MyChart app! Go to the app store, search "MyChart", open the app, select [facility], and log in with your MyChart username and password. ? ?Due to Covid, a mask is required upon entering the hospital/clinic. If you do not have a mask, one will be given to you upon arrival. For doctor visits, patients may have 1 support person aged 18 or older with them. For treatment visits, patients cannot have anyone with them due to current Covid guidelines and our immunocompromised population.  ?

## 2021-05-10 ENCOUNTER — Other Ambulatory Visit (HOSPITAL_COMMUNITY): Payer: Self-pay | Admitting: Specialist

## 2021-05-10 DIAGNOSIS — R1312 Dysphagia, oropharyngeal phase: Secondary | ICD-10-CM

## 2021-06-07 ENCOUNTER — Ambulatory Visit (HOSPITAL_COMMUNITY)
Admission: RE | Admit: 2021-06-07 | Discharge: 2021-06-07 | Disposition: A | Discharge status: Home / Self Care | Payer: Medicare Other | Source: Ambulatory Visit | Attending: Neurology | Admitting: Neurology

## 2021-06-07 ENCOUNTER — Encounter (HOSPITAL_COMMUNITY): Payer: Self-pay | Admitting: Speech Pathology

## 2021-06-07 ENCOUNTER — Ambulatory Visit (HOSPITAL_COMMUNITY): Payer: Medicare Other | Attending: Neurology | Admitting: Speech Pathology

## 2021-06-07 DIAGNOSIS — R1312 Dysphagia, oropharyngeal phase: Secondary | ICD-10-CM | POA: Insufficient documentation

## 2021-06-07 NOTE — Therapy (Signed)
[facility] [address] [place], [place], [address] Phone: [phone]   Fax:  [phone]  Modified Barium Swallow  Patient Details  Name: Alex [name] MRN: [id number] Date of Birth: 17-Sep-1955 No data recorded  Encounter Date: 06/07/2021   End of Session - 06/07/21 1533     Visit Number 1    Number of Visits 1    Authorization Type [employer] Medicare    SLP Start Time 1120    SLP Stop Time  1153    SLP Time Calculation (min) 33 min    Activity Tolerance Patient tolerated treatment well             Past Medical History:  Diagnosis Date   Anxiety    Arthritis    High cholesterol    Hypertension    Polycythemia, 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   Subjective Assessment - 06/07/21 1504     Subjective "I have trouble swallowing solid foods."    Special Tests MBSS    Currently in Pain? No/denies                 General - 06/07/21 1507       General Information   Date of Onset 05/08/21    HPI Alex OkRonnie Alessandrini is a 66 yo male who was referred by Dr. Beryle BeamsKofi Doonquah for MBSS due to Pt reports of intermittent dysphagia to pills and solid/dry foods. He was diagnosed with Parkinson's disease in November 2022 and started on Sinimet for his left hand tremor. He reports history of 2-3 falls in the past year or so due to decreased balance. He has not been seen by SLP or PT previously. Pt reports reduced vocal intensity at times and wife reports "slurry" speech at times.    Type of Study MBS-Modified Barium Swallow Study    Previous Swallow Assessment None on record    Diet Prior to this Study Regular;Thin liquids    Temperature Spikes Noted No    Respiratory Status Room air    History of Recent Intubation No    Behavior/Cognition Alert;Cooperative;Pleasant mood    Oral  Cavity Assessment Within Functional Limits    Oral Care Completed by SLP No    Oral Cavity - Dentition Adequate natural dentition    Vision Functional for self feeding    Self-Feeding Abilities Able to feed self    Patient Positioning Upright in chair    Baseline Vocal Quality Normal    Volitional Cough Strong    Volitional Swallow Able to elicit    Anatomy Within functional limits    Pharyngeal Secretions Not observed secondary MBS                Oral Preparation/Oral Phase - 06/07/21 1522       Oral Preparation/Oral Phase   Oral Phase Within functional limits      Electrical stimulation - Oral Phase   Was Electrical Stimulation Used No              Pharyngeal Phase - 06/07/21 1523       Pharyngeal Phase   Pharyngeal Phase Impaired      Pharyngeal - Thin   Pharyngeal- Thin Teaspoon Within functional limits    Pharyngeal- Thin Cup Within functional limits  Pharyngeal- Thin Straw Swallow initiation at pyriform sinus;Reduced airway/laryngeal closure;Penetration/Aspiration during swallow;Trace aspiration    Pharyngeal Material enters airway, passes BELOW cords then ejected out      Pharyngeal - Solids   Pharyngeal- Puree Within functional limits    Pharyngeal- Regular Within functional limits    Pharyngeal- Pill Swallow initiation at vallecula;Pharyngeal residue - valleculae   Pt swallowed the thin first and then the pill remained in valleculae, cleared with liquid wash     Electrical Stimulation - Pharyngeal Phase   Was Electrical Stimulation Used No              Cricopharyngeal Phase - 06/07/21 1531       Cervical Esophageal Phase   Cervical Esophageal Phase Impaired      Cervical Esophageal Phase - Thin   Thin Cup Prominent cricopharyngeal segment                      Plan - 06/07/21 1533     Clinical Impression Statement Pt presents with normal to mild oropharyngeal dysphagia characterized by slight difficulty with bolus  cohesiveness with mixed consistencies (barium tablet with cup thin) where Pt swallowed the thin liquid (head of bolus) and the barium tablet (tail of bolus) remained in valleculae. Pt then swallowed a sip of liquid to clear effectively. Pt exhibited premature spillage with thin liquids via straw sip to the level of the pyriforms resulting in trace penetration and aspiration of thins (spills over arytenoids) just below the vocal folds, but is immediately expelled with a spontaneous cough. Pt's demonstrated normal swallow function and physiology with all other presentations (thin via tsp/cup, puree, and regular textures). Pt was sensate to the brief stasis of barium tablet in the valleculae and he indicates that the sensation is the same as he feels periodically with solids at home. Recommend regular textures and thin liquids and ok for pills with thin and follow with liquid wash, cut pieces of meats and breads to bite size or chew thoroughly. No dysphagia therapy recommended at this time. The imaging from MBSS was reviewed with Pt and spouse. He reports xerstomia, occasional coughing on his saliva, and exhibited throat clearing during our visit. Pt also noted to have prominent cricopharyngeus, possibly related to reflux. Pt was given reflux and esophageal precautions to follow at home and encouraged to swallow more frequently. He may benefit from PT evaluation due to history of falls with his report of decreased balance in setting of Parkinson's. Pt may also benefit from SLP evaluation for dysarthria given Pt and spousal reports of reduced vocal intensity and occasional decreased speech intelligibility in setting of Parkinson's.    Consulted and Agree with Plan of Care Patient             Patient will benefit from skilled therapeutic intervention in order to improve the following deficits and impairments:   Oropharyngeal dysphagia     Recommendations/Treatment - 06/07/21 1531       Swallow Evaluation  Recommendations   SLP Diet Recommendations Thin;Age appropriate regular    Liquid Administration via Cup    Medication Administration Whole meds with liquid    Supervision Patient able to self feed    Postural Changes Seated upright at 90 degrees;Remain upright for at least 30 minutes after feeds/meals              Prognosis - 06/07/21 1532       Prognosis   Prognosis for Safe Diet Advancement Good  Individuals Consulted   Consulted and Agree with Results and Recommendations Patient;Family member/caregiver    Report Sent to  Referring physician             Problem List Patient Active Problem List   Diagnosis Date Noted   Polycythemia, 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

## 2021-07-02 NOTE — Progress Notes (Unsigned)
[facility] [address] [place], [place] [address]   CLINIC:  Medical Oncology/Hematology  PCP:  Elfredia Nevins, MD [address] [place] [address] [phone]   REASON FOR VISIT:  Follow-up for secondary polycythemia  PRIOR THERAPY: None  CURRENT THERAPY: Intermittent phlebotomy  INTERVAL HISTORY:  Alex [name] 66 y.o. male returns for routine follow-up of secondary polycythemia in the setting of testosterone injections.  He was last seen by Rojelio Brenner PA-C on 03/07/2021.  He received therapeutic phlebotomy on 03/08/2021 and 05/03/2021.  At today's visit, he reports feeling fairly well.  ***  No recent hospitalizations, surgeries, or changes in baseline health status.  *** Symptoms after phlebotomy?  (Fatigue, aquagenic pruritus, intermittent blurry vision/headache/lightheadedness) He continues to take testosterone ***. *** He continues to endorse aquagenic pruritus with itching in his feet and legs after showers as well as feet turning purple for about 5 minutes. *** He also has blurry vision with intermittent headaches and lightheadedness.  He denies any strokelike symptoms, tinnitus, or erythromelalgia. *** He is taking aspirin 81 mg daily.  ***  He has  *** % energy and  *** % appetite. He endorses that he is maintaining a stable weight.   REVIEW OF SYSTEMS:  ***  Review of Systems  Constitutional:  Positive for fatigue. Negative for appetite change, chills, diaphoresis, fever and unexpected weight change.  HENT:   Positive for trouble swallowing. Negative for lump/mass and nosebleeds.   Eyes:  Negative for eye problems.  Respiratory:  Negative for cough, hemoptysis and shortness of breath.   Cardiovascular:  Negative for chest pain, leg swelling and palpitations.  Gastrointestinal:  Negative for abdominal pain, blood in stool, constipation, diarrhea, nausea and vomiting.  Genitourinary:  Negative for hematuria.   Musculoskeletal:   Positive for arthralgias.  Skin: Negative.   Neurological:  Positive for dizziness. Negative for headaches and light-headedness.  Hematological:  Does not bruise/bleed easily.  Psychiatric/Behavioral:  The patient is nervous/anxious.       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   Past Surgical History:  Procedure Laterality Date   BACK SURGERY  2019   NECK SURGERY  2022   SHOULDER SURGERY       SOCIAL HISTORY:  Social History   Socioeconomic History   Marital status: Married    Spouse name: Not on file   Number of children: Not on file   Years of education: Not on file   Highest education level: Not on file  Occupational History   Not on file  Tobacco Use   Smoking status: Not on file   Smokeless tobacco: Not on file  Substance and Sexual Activity   Alcohol use: Not on file   Drug use: Not on file   Sexual activity: Not on file  Other Topics Concern   Not on file  Social History Narrative   Not on file   Social Determinants of Health   Financial Resource Strain: Not on file  Food Insecurity: Not on file  Transportation Needs: Not on file  Physical Activity: Not on file  Stress: Not on file  Social Connections: Not on file  Intimate Partner Violence: Not on file    FAMILY HISTORY:  No family history on file.  CURRENT MEDICATIONS:  Outpatient Encounter Medications as of 07/03/2021  Medication Sig   amantadine (  SYMMETREL) 100 MG capsule Take 100 mg by mouth 2 (two) times daily.   aspirin EC 81 MG tablet Take 1 tablet (81 mg total) by mouth daily. Swallow whole.   atorvastatin (LIPITOR) 40 MG tablet Take 40 mg by mouth daily.   carbidopa-levodopa (SINEMET IR) 25-100 MG tablet Take 1 tablet by mouth 4 (four) times daily.   citalopram (CELEXA) 40 MG tablet Take by mouth.   folic acid (FOLVITE) 1 MG tablet folic acid 1 mg tablet    gabapentin (NEURONTIN) 300 MG capsule Take 300 mg by mouth daily.   levothyroxine (SYNTHROID) 25 MCG tablet Take 25 mcg by mouth daily.   Levothyroxine Sodium 25 MCG CAPS Take by mouth.   metoprolol succinate (TOPROL-XL) 50 MG 24 hr tablet metoprolol succinate ER 50 mg tablet,extended release 24 hr   tadalafil (CIALIS) 20 MG tablet Take 1 tablet (20 mg total) by mouth daily as needed.   tadalafil (CIALIS) 5 MG tablet Take 1 tablet (5 mg total) by mouth daily.   VITAMIN D, CHOLECALCIFEROL, PO Take by mouth.   No facility-administered encounter medications on file as of 07/03/2021.    ALLERGIES:  Allergies  Allergen Reactions   Oxycodone      PHYSICAL EXAM:  ECOG PERFORMANCE STATUS: 1 - Symptomatic but completely ambulatory ***   There were no vitals filed for this visit. There were no vitals filed for this visit. Physical Exam Constitutional:      Appearance: Normal appearance. He is obese.  HENT:     Head: Normocephalic and atraumatic.     Mouth/Throat:     Mouth: Mucous membranes are moist.  Eyes:     Extraocular Movements: Extraocular movements intact.     Pupils: Pupils are equal, round, and reactive to light.  Cardiovascular:     Rate and Rhythm: Regular rhythm. Bradycardia present.     Pulses: Normal pulses.     Heart sounds: Normal heart sounds.  Pulmonary:     Effort: Pulmonary effort is normal.     Breath sounds: Normal breath sounds.  Abdominal:     General: Bowel sounds are normal.     Palpations: Abdomen is soft.     Tenderness: There is no abdominal tenderness.  Musculoskeletal:        General: No swelling.     Right lower leg: No edema.     Left lower leg: No edema.  Lymphadenopathy:     Cervical: No cervical adenopathy.  Skin:    General: Skin is warm and dry.  Neurological:     General: No focal deficit present.     Mental Status: He is alert and oriented to person, place, and time.  Psychiatric:        Mood and Affect: Mood normal.         Behavior: Behavior normal.      LABORATORY DATA:  I have reviewed the labs as listed.  CBC    Component Value Date/Time   WBC 6.0 05/03/2021 1231   RBC 5.52 05/03/2021 1231   HGB 16.6 05/03/2021 1231   HCT 50.8 05/03/2021 1231   PLT 229 05/03/2021 1231   MCV 92.0 05/03/2021 1231   MCH 30.1 05/03/2021 1231   MCHC 32.7 05/03/2021 1231   RDW 13.2 05/03/2021 1231   LYMPHSABS 1.3 02/16/2021 1018   MONOABS 0.5 02/16/2021 1018   EOSABS 0.1 02/16/2021 1018   BASOSABS 0.0 02/16/2021 1018       No data to display  DIAGNOSTIC IMAGING:  I have independently reviewed the relevant imaging and discussed with the patient.  ASSESSMENT & PLAN: 1.  Secondary polycythemia  - Seen at the request of Dr. Sherwood Gambler.  Labs sent from PCP on 01/27/2021 showed Hgb 17.9, HCT 53.1, and RBC 5.91 - Receives testosterone 400 mg every 2 weeks x 12 years ***  - He is a lifelong non-smoker. - No family history of polycythemia or hemochromatosis. - MPN work-up negative (no JAK2, CALR, or MPL mutations identified) - Erythropoietin mildly elevated at 20.0 (02/16/2021), but renal ultrasound (03/15/2021) was within normal limits - Symptomatic with fatigue, aquagenic pruritus, blurry vision, and occasional headaches.  Feet sometimes turn purple after showers, lasts for about 5 minutes. *** - We performed therapeutic phlebotomy on 03/08/2021 and 05/03/2021, due to high symptom burden - Reports that symptoms  *** after phlebotomy *** - Most recent CBC (07/03/2021) *** - Discussed that indications for phlebotomy is indicated for secondary polycythemia in cases of severe symptoms (strokelike symptoms, recurrent headaches, severe fatigue) or if HCT is greater than 54 - Differential diagnosis favors secondary polycythemia secondary to testosterone supplementation - PLAN: Recommend therapeutic phlebotomy or voluntary blood donation once every 2 months due to high burden of symptoms.   ***  - Continue aspirin 81 mg daily  due to erythrocytosis in the setting of other cardiac risk factors (hypertension, hyperlipidemia) - Repeat CBC and RTC in 4  *** months.  2.  Weight loss ***  - Patient had 20 to 30 pound weight loss in the past year due to trouble swallowing from Parkinson's disease.  He is on treatment for Parkinson's disease.  3.  Social/family history: - He is a retired [employer].  No smoking history. - No family history of polycythemia.  Paternal grandmother had stomach cancer.     PLAN SUMMARY & DISPOSITION: CBC + phlebotomy every 2 months ***  Labs and RTC in 4 months ***   All questions were answered. The patient knows to call the clinic with any problems, questions or concerns.  Medical decision making: Moderate ***   Time spent on visit: I spent 20 minutes counseling the patient face to face. The total time spent in the appointment was 30 minutes and more than 50% was on counseling.   Carnella Guadalajara, PA-C   ***

## 2021-07-03 ENCOUNTER — Inpatient Hospital Stay (HOSPITAL_COMMUNITY): Payer: Medicare Other

## 2021-07-03 ENCOUNTER — Inpatient Hospital Stay (HOSPITAL_COMMUNITY): Payer: Medicare Other | Attending: Hematology | Admitting: Physician Assistant

## 2021-07-03 VITALS — BP 142/83 | HR 58 | Temp 97.9°F | Resp 18 | Ht 68.0 in | Wt 237.9 lb

## 2021-07-03 DIAGNOSIS — Z79890 Hormone replacement therapy (postmenopausal): Secondary | ICD-10-CM | POA: Insufficient documentation

## 2021-07-03 DIAGNOSIS — E7800 Pure hypercholesterolemia, unspecified: Secondary | ICD-10-CM | POA: Diagnosis not present

## 2021-07-03 DIAGNOSIS — Z7982 Long term (current) use of aspirin: Secondary | ICD-10-CM | POA: Insufficient documentation

## 2021-07-03 DIAGNOSIS — D751 Secondary polycythemia: Secondary | ICD-10-CM

## 2021-07-03 DIAGNOSIS — I10 Essential (primary) hypertension: Secondary | ICD-10-CM | POA: Diagnosis not present

## 2021-07-03 LAB — CBC
HCT: 49.7 % (ref 39.0–52.0)
Hemoglobin: 16.2 g/dL (ref 13.0–17.0)
MCH: 29.3 pg (ref 26.0–34.0)
MCHC: 32.6 g/dL (ref 30.0–36.0)
MCV: 90 fL (ref 80.0–100.0)
Platelets: 232 10*3/uL (ref 150–400)
RBC: 5.52 MIL/uL (ref 4.22–5.81)
RDW: 13.2 % (ref 11.5–15.5)
WBC: 6.3 10*3/uL (ref 4.0–10.5)
nRBC: 0 % (ref 0.0–0.2)

## 2021-07-03 NOTE — Progress Notes (Signed)
No phlebotomy needed today per protocol.

## 2021-07-03 NOTE — Patient Instructions (Signed)
[facility] Discharge Instructions  You were seen today by Rojelio Brenner PA-C for your elevated blood counts.  The labs we checked were negative for any signs of blood cancer.  We suspect your elevated hemoglobin/red blood cells is due to your testosterone supplementation.  You can still continue to receive testosterone supplements, since we will be treating your elevated red blood cells with phlebotomy (see below).  Due to your symptoms, I recommend that you donate blood once every 3 months to help decrease the amount of extra red blood cells in your body.  We will set you up to have this blood donation procedure (called PHLEBOTOMY) done here at our office.  We will check your blood counts on the same day as phlebotomy before any blood is taken.  You should also start taking aspirin 81 mg daily to reduce your risk of heart attack, stroke, and blood clot.  We will check your labs and see you for follow-up visit in 6 months.    Thank you for choosing [facility] to provide your oncology and hematology care.  To afford each patient quality time with our provider, please arrive at least 15 minutes before your scheduled appointment time.   If you have a lab appointment with the Cancer Center please come in thru the Main Entrance and check in at the main information desk.  You need to re-schedule your appointment should you arrive 10 or more minutes late.  We strive to give you quality time with our providers, and arriving late affects you and other patients whose appointments are after yours.  Also, if you no show three or more times for appointments you may be dismissed from the clinic at the providers discretion.     Again, thank you for choosing [facility].  Our hope is that these requests will decrease the amount of time that you wait before being seen by our physicians.        _____________________________________________________________  Should you have questions after your visit to [facility], please contact our office at [phone] and follow the prompts.  Our office hours are 8:00 a.m. and 4:30 p.m. Monday - Friday.  Please note that voicemails left after 4:00 p.m. may not be returned until the following business day.  We are closed weekends and major holidays.  You do have access to a nurse 24-7, just call the main number to the clinic [phone] and do not press any options, hold on the line and a nurse will answer the phone.    For prescription refill requests, have your pharmacy contact our office and allow 72 hours.    Due to Covid, you will need to wear a mask upon entering the hospital. If you do not have a mask, a mask will be given to you at the Main Entrance upon arrival. For doctor visits, patients may have 1 support person age 29 or older with them. For treatment visits, patients can not have anyone with them due to social distancing guidelines and our immunocompromised population.

## 2021-07-11 ENCOUNTER — Ambulatory Visit: Payer: Medicare Other | Admitting: Urology

## 2021-07-11 ENCOUNTER — Encounter: Payer: Self-pay | Admitting: Urology

## 2021-07-11 VITALS — BP 119/76 | HR 64

## 2021-07-11 DIAGNOSIS — N529 Male erectile dysfunction, unspecified: Secondary | ICD-10-CM | POA: Diagnosis not present

## 2021-07-11 MED ORDER — TADALAFIL 20 MG PO TABS: 20.0000 mg | ORAL_TABLET | Freq: Every day | ORAL | 5 refills | Status: DC | PRN

## 2021-07-11 MED ORDER — TADALAFIL 5 MG PO TABS: 5.0000 mg | ORAL_TABLET | Freq: Every day | ORAL | 11 refills | Status: DC

## 2021-07-11 NOTE — Patient Instructions (Signed)
Erectile Dysfunction ?Erectile dysfunction (ED) is the inability to get or keep an erection in order to have sexual intercourse. ED is considered a symptom of an underlying disorder and is not considered a disease. ED may include: ?Inability to get an erection. ?Lack of enough hardness of the erection to allow penetration. ?Loss of erection before sex is finished. ?What are the causes? ?This condition may be caused by: ?Physical causes, such as: ?Artery problems. This may include heart disease, high blood pressure, atherosclerosis, and diabetes. ?Hormonal problems, such as low testosterone. ?Obesity. ?Nerve problems. This may include back or pelvic injuries, multiple sclerosis, Parkinson's disease, spinal cord injury, and stroke. ?Certain medicines, such as: ?Pain relievers. ?Antidepressants. ?Blood pressure medicines and water pills (diuretics). ?Cancer medicines. ?Antihistamines. ?Muscle relaxants. ?Lifestyle factors, such as: ?Use of drugs such as marijuana, cocaine, or opioids. ?Excessive use of alcohol. ?Smoking. ?Lack of physical activity or exercise. ?Psychological causes, such as: ?Anxiety or stress. ?Sadness or depression. ?Exhaustion. ?Fear about sexual performance. ?Guilt. ?What are the signs or symptoms? ?Symptoms of this condition include: ?Inability to get an erection. ?Lack of enough hardness of the erection to allow penetration. ?Loss of the erection before sex is finished. ?Sometimes having normal erections, but with frequent unsatisfactory episodes. ?Low sexual satisfaction in either partner due to erection problems. ?A curved penis occurring with erection. The curve may cause pain, or the penis may be too curved to allow for intercourse. ?Never having nighttime or morning erections. ?How is this diagnosed? ?This condition is often diagnosed by: ?Performing a physical exam to find other diseases or specific problems with the penis. ?Asking you detailed questions about the problem. ?Doing tests,  such as: ?Blood tests to check for diabetes mellitus or high cholesterol, or to measure hormone levels. ?Other tests to check for underlying health conditions. ?An ultrasound exam to check for scarring. ?A test to check blood flow to the penis. ?Doing a sleep study at home to measure nighttime erections. ?How is this treated? ?This condition may be treated by: ?Medicines, such as: ?Medicine taken by mouth to help you achieve an erection (oral medicine). ?Hormone replacement therapy to replace low testosterone levels. ?Medicine that is injected into the penis. Your health care provider may instruct you how to give yourself these injections at home. ?Medicine that is delivered with a short applicator tube. The tube is inserted into the opening at the tip of the penis, which is the opening of the urethra. A tiny pellet of medicine is put in the urethra. The pellet dissolves and enhances erectile function. This is also called MUSE (medicated urethral system for erections) therapy. ?Vacuum pump. This is a pump with a ring on it. The pump and ring are placed on the penis and used to create pressure that helps the penis become erect. ?Penile implant surgery. In this procedure, you may receive: ?An inflatable implant. This consists of cylinders, a pump, and a reservoir. The cylinders can be inflated with a fluid that helps to create an erection, and they can be deflated after intercourse. ?A semi-rigid implant. This consists of two silicone rubber rods. The rods provide some rigidity. They are also flexible, so the penis can both curve downward in its normal position and become straight for sexual intercourse. ?Blood vessel surgery to improve blood flow to the penis. During this procedure, a blood vessel from a different part of the body is placed into the penis to allow blood to flow around (bypass) damaged or blocked blood vessels. ?Lifestyle changes,   such as exercising more, losing weight, and quitting smoking. ?Follow  these instructions at home: ?Medicines ? ?Take over-the-counter and prescription medicines only as told by your health care provider. Do not increase the dosage without first discussing it with your health care provider. ?If you are using self-injections, do injections as directed by your health care provider. Make sure you avoid any veins that are on the surface of the penis. After giving an injection, apply pressure to the injection site for 5 minutes. ?Talk to your health care provider about how to prevent headaches while taking ED medicines. These medicines may cause a sudden headache due to the increase in blood flow in your body. ?General instructions ?Exercise regularly, as directed by your health care provider. Work with your health care provider to lose weight, if needed. ?Do not use any products that contain nicotine or tobacco. These products include cigarettes, chewing tobacco, and vaping devices, such as e-cigarettes. If you need help quitting, ask your health care provider. ?Before using a vacuum pump, read the instructions that come with the pump and discuss any questions with your health care provider. ?Keep all follow-up visits. This is important. ?Contact a health care provider if: ?You feel nauseous. ?You are vomiting. ?You get sudden headaches while taking ED medicines. ?You have any concerns about your sexual health. ?Get help right away if: ?You are taking oral or injectable medicines and you have an erection that lasts longer than 4 hours. If your health care provider is unavailable, go to the nearest emergency room for evaluation. An erection that lasts much longer than 4 hours can result in permanent damage to your penis. ?You have severe pain in your groin or abdomen. ?You develop redness or severe swelling of your penis. ?You have redness spreading at your groin or lower abdomen. ?You are unable to urinate. ?You experience chest pain or a rapid heartbeat (palpitations) after taking oral  medicines. ?These symptoms may represent a serious problem that is an emergency. Do not wait to see if the symptoms will go away. Get medical help right away. Call your local emergency services (911 in the [place] Do not drive yourself to the hospital. ?Summary ?Erectile dysfunction (ED) is the inability to get or keep an erection during sexual intercourse. ?This condition is diagnosed based on a physical exam, your symptoms, and tests to determine the cause. Treatment varies depending on the cause and may include medicines, hormone therapy, surgery, or a vacuum pump. ?You may need follow-up visits to make sure that you are using your medicines or devices correctly. ?Get help right away if you are taking or injecting medicines and you have an erection that lasts longer than 4 hours. ?This information is not intended to replace advice given to you by your health care provider. Make sure you discuss any questions you have with your health care provider. ?Document Revised: 03/29/2020 Document Reviewed: 03/29/2020 ?Elsevier Patient Education ? 2023 Elsevier Inc. ? ?

## 2021-07-11 NOTE — Progress Notes (Signed)
07/11/2021 10:48 AM   Alex [name] 07-15-1955 [id number]  Referring provider: Elfredia Nevins, MD [address] [place],  [place] [address]  Erectile dysfunction   HPI: Mr Alex [name] is [phone] here for followup for erectile dysfunction. He is on tadalafil 5mg  daily and occasionally uses 20mg  prn. He gets a firm erection. He was recently diagnosed with Parkinsons disease. He denies any worsening LUTS. No other complaints today.   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 If you have any questions, ask your nurse or doctor.          amantadine 100 MG capsule Commonly known as: SYMMETREL Take 100 mg by mouth 2 (two) times daily.   aspirin EC 81 MG tablet Take 1 tablet (81 mg total) by mouth daily. Swallow whole.   atorvastatin 40 MG tablet Commonly known as: LIPITOR Take 40 mg by mouth daily.   carbidopa-levodopa 25-100 MG tablet Commonly known as: SINEMET IR Take 1 tablet by mouth 4 (four) times daily.   citalopram 40 MG tablet Commonly known as: CELEXA Take by mouth.   folic acid 1 MG tablet Commonly known as: FOLVITE folic acid 1 mg tablet   gabapentin 300 MG capsule Commonly known as: NEURONTIN Take 300 mg by mouth daily.   Levothyroxine Sodium 25 MCG Caps Take by mouth.   levothyroxine 25 MCG tablet Commonly known as: SYNTHROID Take 25 mcg by mouth daily.   metoprolol succinate 50 MG 24 hr tablet Commonly known as: TOPROL-XL metoprolol succinate ER 50 mg tablet,extended release 24 hr   tadalafil 20 MG tablet Commonly known as: CIALIS Take 1 tablet  (20 mg total) by mouth daily as needed.   tadalafil 5 MG tablet Commonly known as: CIALIS Take 1 tablet (5 mg total) by mouth daily.   testosterone cypionate 200 MG/ML injection Commonly known as: DEPOTESTOSTERONE CYPIONATE SMARTSIG:Milliliter(s) IM   VITAMIN D (CHOLECALCIFEROL) PO Take by mouth.        Allergies:  Allergies  Allergen Reactions   Oxycodone     Family History: No family history on file.  Social History:  has no history on file for tobacco use, alcohol use, and drug use.  ROS: All other review of systems were reviewed and are negative except what is noted above in HPI  Physical Exam: BP 119/76   Pulse 64   Constitutional:  Alert and oriented, No acute distress. HEENT: Alex [name] AT, moist mucus membranes.  Trachea midline, no masses. Cardiovascular: No clubbing, cyanosis, or edema. Respiratory: Normal respiratory effort, no increased work of breathing. GI: Abdomen is soft, nontender, nondistended, no abdominal masses GU: No CVA tenderness.  Lymph: No cervical or inguinal lymphadenopathy. Skin: No rashes, bruises or suspicious lesions. Neurologic: Grossly intact, no focal deficits, moving all 4 extremities. Psychiatric: Normal mood and affect.  Laboratory Data: Lab Results  Component Value Date   WBC 6.3 07/03/2021   HGB 16.2 07/03/2021   HCT 49.7 07/03/2021  MCV 90.0 07/03/2021   PLT 232 07/03/2021    No results found for: "CREATININE"  No results found for: "PSA"  No results found for: "TESTOSTERONE"  No results found for: "HGBA1C"  Urinalysis No results found for: "COLORURINE", "APPEARANCEUR", "LABSPEC", "PHURINE", "GLUCOSEU", "HGBUR", "BILIRUBINUR", "KETONESUR", "PROTEINUR", "UROBILINOGEN", "NITRITE", "LEUKOCYTESUR"  No results found for: "LABMICR", "WBCUA", "RBCUA", "LABEPIT", "MUCUS", "BACTERIA"  Pertinent Imaging:  No results found for this or any previous visit.  No results found for this or any previous visit.  No results found  for this or any previous visit.  No results found for this or any previous visit.  Results for orders placed during the hospital encounter of 03/15/21  US RENAL  Narrative CLINICAL DATA:  Polycythemia and elevated erythropoietin.  EXAM: RENAL / URINARY TRACT ULTRASOUND COMPLETE  COMPARISON:  None.  FINDINGS: Right Kidney:  Renal measurements: 10.5 cm x 6.2 cm x 4.9 cm = volume: 167.2 mL. Echogenicity within normal limits. No mass or hydronephrosis visualized.  Left Kidney:  Renal measurements: 11.1 cm x 5.5 cm x 5.6 cm = volume: 179.0 mL. Echogenicity within normal limits. No mass or hydronephrosis visualized.  Bladder:  Appears normal for degree of bladder distention. Bilateral ureteral jets are visualized.  Other:  None.  IMPRESSION: Normal renal ultrasound.   Electronically Signed By: Aram Candela M.D. On: 03/16/2021 20:43  No results found for this or any previous visit.  No results found for this or any previous visit.  No results found for this or any previous visit.   Assessment & Plan:    1. Erectile dysfunction, unspecified erectile dysfunction type -continue tadalafil 5mg  daily and 20mg  prn   No follow-ups on file.  , MD  [facility] [place]

## 2021-07-12 ENCOUNTER — Other Ambulatory Visit: Payer: Self-pay

## 2021-07-12 ENCOUNTER — Telehealth: Payer: Self-pay

## 2021-07-12 MED ORDER — TADALAFIL 5 MG PO TABS: 5.0000 mg | ORAL_TABLET | Freq: Every day | ORAL | 11 refills | Status: DC

## 2021-07-12 MED ORDER — TADALAFIL 20 MG PO TABS: 20.0000 mg | ORAL_TABLET | Freq: Every day | ORAL | 5 refills | Status: DC | PRN

## 2021-07-12 NOTE — Telephone Encounter (Signed)
Patient called advising he needed medication called into [facility]. He advised he no longer uses. [facility] for this medication.    Medication: tadalafil (CIALIS) 5 MG tablet tadalafil (CIALIS) 20 MG tablet

## 2021-07-12 NOTE — Telephone Encounter (Signed)
Refills sent to [facility]  

## 2021-07-25 DIAGNOSIS — M47816 Spondylosis without myelopathy or radiculopathy, lumbar region: Secondary | ICD-10-CM | POA: Diagnosis not present

## 2021-07-25 DIAGNOSIS — H6123 Impacted cerumen, bilateral: Secondary | ICD-10-CM | POA: Diagnosis not present

## 2021-07-25 DIAGNOSIS — M5416 Radiculopathy, lumbar region: Secondary | ICD-10-CM | POA: Diagnosis not present

## 2021-07-25 DIAGNOSIS — G20 Parkinson's disease: Secondary | ICD-10-CM | POA: Diagnosis not present

## 2021-08-20 DIAGNOSIS — G20 Parkinson's disease: Secondary | ICD-10-CM | POA: Diagnosis not present

## 2021-08-20 DIAGNOSIS — I10 Essential (primary) hypertension: Secondary | ICD-10-CM | POA: Diagnosis not present

## 2021-08-20 DIAGNOSIS — Z0001 Encounter for general adult medical examination with abnormal findings: Secondary | ICD-10-CM | POA: Diagnosis not present

## 2021-08-20 DIAGNOSIS — E559 Vitamin D deficiency, unspecified: Secondary | ICD-10-CM | POA: Diagnosis not present

## 2021-08-20 DIAGNOSIS — E039 Hypothyroidism, unspecified: Secondary | ICD-10-CM | POA: Diagnosis not present

## 2021-08-20 DIAGNOSIS — D518 Other vitamin B12 deficiency anemias: Secondary | ICD-10-CM | POA: Diagnosis not present

## 2021-08-20 DIAGNOSIS — H6121 Impacted cerumen, right ear: Secondary | ICD-10-CM | POA: Diagnosis not present

## 2021-09-11 ENCOUNTER — Telehealth: Payer: Self-pay

## 2021-09-11 MED ORDER — TADALAFIL 5 MG PO TABS: 5.0000 mg | ORAL_TABLET | Freq: Every day | ORAL | 11 refills | Status: DC

## 2021-09-11 NOTE — Telephone Encounter (Signed)
Patient requesting tadalafil (CIALIS) 5 MG tablet to be called into  [facility] [phone] - [place], [place] - [address] AT [facility] OF [address] Phone:  [phone]  Fax:  [phone]     [facility] does not have any in stock currently.  Please advise.  Thanks, Rosey Bath

## 2021-09-11 NOTE — Telephone Encounter (Signed)
Refill submitted. 

## 2021-10-03 ENCOUNTER — Inpatient Hospital Stay: Payer: Medicare Other

## 2021-10-03 ENCOUNTER — Inpatient Hospital Stay: Payer: Medicare Other | Attending: Hematology

## 2021-10-22 DIAGNOSIS — G4733 Obstructive sleep apnea (adult) (pediatric): Secondary | ICD-10-CM | POA: Diagnosis not present

## 2021-10-22 DIAGNOSIS — G20A2 Parkinson's disease without dyskinesia, with fluctuations: Secondary | ICD-10-CM | POA: Diagnosis not present

## 2021-10-22 DIAGNOSIS — I951 Orthostatic hypotension: Secondary | ICD-10-CM | POA: Diagnosis not present

## 2021-10-22 DIAGNOSIS — G903 Multi-system degeneration of the autonomic nervous system: Secondary | ICD-10-CM | POA: Diagnosis not present

## 2021-12-27 ENCOUNTER — Inpatient Hospital Stay: Payer: Medicare Other | Attending: Hematology

## 2021-12-27 ENCOUNTER — Inpatient Hospital Stay: Payer: Medicare Other

## 2021-12-27 ENCOUNTER — Inpatient Hospital Stay (HOSPITAL_BASED_OUTPATIENT_CLINIC_OR_DEPARTMENT_OTHER): Payer: Medicare Other | Admitting: Physician Assistant

## 2021-12-27 VITALS — BP 163/83 | HR 57 | Temp 98.6°F | Resp 17 | Ht 68.0 in | Wt 242.4 lb

## 2021-12-27 DIAGNOSIS — D751 Secondary polycythemia: Secondary | ICD-10-CM | POA: Diagnosis not present

## 2021-12-27 DIAGNOSIS — Z79899 Other long term (current) drug therapy: Secondary | ICD-10-CM | POA: Diagnosis not present

## 2021-12-27 DIAGNOSIS — Z7982 Long term (current) use of aspirin: Secondary | ICD-10-CM | POA: Diagnosis not present

## 2021-12-27 DIAGNOSIS — Z79890 Hormone replacement therapy (postmenopausal): Secondary | ICD-10-CM | POA: Diagnosis not present

## 2021-12-27 DIAGNOSIS — G20A1 Parkinson's disease without dyskinesia, without mention of fluctuations: Secondary | ICD-10-CM | POA: Insufficient documentation

## 2021-12-27 DIAGNOSIS — I10 Essential (primary) hypertension: Secondary | ICD-10-CM | POA: Insufficient documentation

## 2021-12-27 DIAGNOSIS — D582 Other hemoglobinopathies: Secondary | ICD-10-CM

## 2021-12-27 LAB — CBC
HCT: 48.1 % (ref 39.0–52.0)
Hemoglobin: 15.8 g/dL (ref 13.0–17.0)
MCH: 29.1 pg (ref 26.0–34.0)
MCHC: 32.8 g/dL (ref 30.0–36.0)
MCV: 88.6 fL (ref 80.0–100.0)
Platelets: 251 10*3/uL (ref 150–400)
RBC: 5.43 MIL/uL (ref 4.22–5.81)
RDW: 14.4 % (ref 11.5–15.5)
WBC: 6.6 10*3/uL (ref 4.0–10.5)
nRBC: 0 % (ref 0.0–0.2)

## 2021-12-27 NOTE — Progress Notes (Signed)
[facility] [address], [place] [address]   CLINIC:  Medical Oncology/Hematology  PCP:  Elfredia Nevins, MD [address] [place] [address] [phone]   REASON FOR VISIT:  Follow-up for secondary polycythemia  PRIOR THERAPY: None  CURRENT THERAPY: Intermittent phlebotomy  INTERVAL HISTORY:  [name] 66 y.o. male returns for routine follow-up of secondary polycythemia in the setting of testosterone injections.  He was last seen by Rojelio Brenner PA-C on 07/03/2021.    At today's visit, he reports feeling fair.   No recent hospitalizations, surgeries, or changes in baseline health status.  He reports weakness, lightheadedness, tremors, difficulty swallowing which he attributes to his Parkinson's disease.  He has not had any blood donation or phlebotomy since his visit 6 months ago.  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  Chronic fatigue and blurry vision are unchanged.  Continues to have chronic headaches.  He denies any strokelike symptoms, tinnitus, or erythromelalgia. He is taking aspirin 81 mg daily. He continues to take testosterone 200 mg every 2 weeks.  He has little to no energy and 85% appetite. He endorses that he is maintaining a stable weight.   REVIEW OF SYSTEMS:    Review of Systems  Constitutional:  Positive for fatigue. Negative for appetite change, chills, diaphoresis, fever and unexpected weight change.  HENT:   Positive for trouble swallowing (Parkinson's). Negative for lump/mass and nosebleeds.   Eyes:  Negative for eye problems.  Respiratory:  Negative for cough, hemoptysis and shortness of breath.   Cardiovascular:  Negative for chest pain, leg swelling and palpitations.  Gastrointestinal:  Negative for abdominal pain, blood in stool, constipation, diarrhea, nausea and vomiting.  Genitourinary:  Negative for hematuria.   Musculoskeletal:  Positive for  arthralgias and back pain.  Skin: Negative.   Neurological:  Positive for dizziness and headaches. Negative for light-headedness.  Hematological:  Does not bruise/bleed easily.  Psychiatric/Behavioral:  Positive for sleep disturbance. The patient is nervous/anxious.       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   Past Surgical History:  Procedure Laterality Date   BACK SURGERY  2019   NECK SURGERY  2022   SHOULDER SURGERY       SOCIAL HISTORY:  Social History   Socioeconomic History   Marital status: Married    Spouse name: Not on file   Number of children: Not on file   Years of education: Not on file   Highest education level: Not on file  Occupational History   Not on file  Tobacco Use   Smoking status: Not on file   Smokeless tobacco: Not on file  Substance and Sexual Activity   Alcohol use: Not on file   Drug use: Not on file   Sexual activity: Not on file  Other Topics Concern   Not on file  Social History Narrative   Not on file   Social Determinants of Health   Financial Resource Strain: Not on file  Food Insecurity: Not on file  Transportation Needs: Not on file  Physical Activity: Not on file  Stress: Not on file  Social Connections: Not on file  Intimate Partner Violence: Not on file    FAMILY HISTORY:  No family history on file.  CURRENT MEDICATIONS:  Outpatient Encounter Medications  as of 12/27/2021  Medication Sig   amantadine (SYMMETREL) 100 MG capsule Take 100 mg by mouth 2 (two) times daily.   aspirin EC 81 MG tablet Take 1 tablet (81 mg total) by mouth daily. Swallow whole.   atorvastatin (LIPITOR) 40 MG tablet Take 40 mg by mouth daily.   carbidopa-levodopa (SINEMET IR) 25-100 MG tablet Take 1 tablet by mouth 4 (four) times daily.   citalopram (CELEXA) 40 MG tablet Take by mouth.   folic acid (FOLVITE) 1  MG tablet folic acid 1 mg tablet   gabapentin (NEURONTIN) 300 MG capsule Take 300 mg by mouth daily.   levothyroxine (SYNTHROID) 25 MCG tablet Take 25 mcg by mouth daily.   Levothyroxine Sodium 25 MCG CAPS Take by mouth.   metoprolol succinate (TOPROL-XL) 50 MG 24 hr tablet metoprolol succinate ER 50 mg tablet,extended release 24 hr   tadalafil (CIALIS) 20 MG tablet Take 1 tablet (20 mg total) by mouth daily as needed.   tadalafil (CIALIS) 5 MG tablet Take 1 tablet (5 mg total) by mouth daily.   testosterone cypionate (DEPOTESTOSTERONE CYPIONATE) 200 MG/ML injection SMARTSIG:Milliliter(s) IM   VITAMIN D, CHOLECALCIFEROL, PO Take by mouth.   No facility-administered encounter medications on file as of 12/27/2021.    ALLERGIES:  Allergies  Allergen Reactions   Oxycodone      PHYSICAL EXAM:  ECOG PERFORMANCE STATUS: 1 - Symptomatic but completely ambulatory    There were no vitals filed for this visit. There were no vitals filed for this visit. Physical Exam Constitutional:      Appearance: Normal appearance. He is obese.  HENT:     Head: Normocephalic and atraumatic.     Mouth/Throat:     Mouth: Mucous membranes are moist.  Eyes:     Extraocular Movements: Extraocular movements intact.     Pupils: Pupils are equal, round, and reactive to light.  Cardiovascular:     Rate and Rhythm: Regular rhythm. Bradycardia present.     Pulses: Normal pulses.     Heart sounds: Normal heart sounds.  Pulmonary:     Effort: Pulmonary effort is normal.     Breath sounds: Normal breath sounds.  Abdominal:     General: Bowel sounds are normal.     Palpations: Abdomen is soft.     Tenderness: There is no abdominal tenderness.  Musculoskeletal:        General: No swelling.     Right lower leg: No edema.     Left lower leg: No edema.  Lymphadenopathy:     Cervical: No cervical adenopathy.  Skin:    General: Skin is warm and dry.  Neurological:     General: No focal deficit present.      Mental Status: He is alert and oriented to person, place, and time.  Psychiatric:        Mood and Affect: Mood normal.        Behavior: Behavior normal.      LABORATORY DATA:  I have reviewed the labs as listed.  CBC    Component Value Date/Time   WBC 6.3 07/03/2021 1245   RBC 5.52 07/03/2021 1245   HGB 16.2 07/03/2021 1245   HCT 49.7 07/03/2021 1245   PLT 232 07/03/2021 1245   MCV 90.0 07/03/2021 1245   MCH 29.3 07/03/2021 1245   MCHC 32.6 07/03/2021 1245   RDW 13.2 07/03/2021 1245   LYMPHSABS 1.3 02/16/2021 1018   MONOABS 0.5 02/16/2021 1018   EOSABS 0.1 02/16/2021 1018  BASOSABS 0.0 02/16/2021 1018       No data to display           DIAGNOSTIC IMAGING:  I have independently reviewed the relevant imaging and discussed with the patient.  ASSESSMENT & PLAN: 1.  Secondary polycythemia  - Seen at the request of Dr. Sherwood Gambler. Labs sent from PCP on 01/27/2021 showed Hgb 17.9, HCT 53.1, and RBC 5.91 - Receives testosterone 400 mg every 2 weeks x 12 years   - He is a lifelong non-smoker. - No family history of polycythemia or hemochromatosis. 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  He has chronic fatigue, blurry vision, and headaches. - We performed therapeutic phlebotomy on 03/08/2021 and 05/03/2021, due to high symptom burden. - Most recent CBC (12/27/2021): Hgb 15.8/hematocrit 48.1 - Discussed that indications for phlebotomy is indicated for secondary polycythemia in cases of severe symptoms (strokelike symptoms, recurrent headaches, severe fatigue) or if HCT is greater than 54 - Differential diagnosis favors secondary polycythemia secondary to testosterone supplementation - PLAN: Recommend therapeutic phlebotomy or voluntary blood donation once every 3 months if HCT  >52.0, due to high burden of symptoms. - Continue aspirin 81 mg daily due to erythrocytosis in the setting of other cardiac risk factors (hypertension, hyperlipidemia) - Repeat CBC and RTC in 6 months.  2.  Weight loss   - Patient had 20 to 30 pound weight loss in the past year due to trouble swallowing from Parkinson's disease.  He is on treatment for Parkinson's disease. - Weight has been stable over the past 3 months  3.  Social/family history: - He is a retired [employer].  No smoking history. - No family history of polycythemia.  Paternal grandmother had stomach cancer.     PLAN SUMMARY: >> CBC + phlebotomy every 3 months   >> Labs (CBC) and RTC in 6 months     All questions were answered. The patient knows to call the clinic with any problems, questions or concerns.  Medical decision making: Low  Time spent on visit: I spent 15 minutes counseling the patient face to face. The total time spent in the appointment was 22 minutes and more than 50% was on counseling.   Carnella Guadalajara, PA-C  12/27/2021 2:32 PM

## 2021-12-27 NOTE — Patient Instructions (Signed)
[facility] Discharge Instructions  You were seen today by Rojelio Brenner PA-C for your elevated blood counts.  The labs we checked were negative for any signs of blood cancer.  We suspect your elevated hemoglobin/red blood cells is due to your testosterone supplementation.  You can still continue to receive testosterone supplements, since we will be treating your elevated red blood cells with phlebotomy (see below).  Due to your symptoms, I recommend that you donate blood once every 3 months to help decrease the amount of extra red blood cells in your body.  We will set you up to have this blood donation procedure (called PHLEBOTOMY) done here at our office.  We will check your blood counts on the same day as phlebotomy before any blood is taken.  You should also continue taking aspirin 81 mg daily to reduce your risk of heart attack, stroke, and blood clot.  We will check your labs and see you for follow-up visit in 6 months.    Thank you for choosing  [facility] to provide your oncology and hematology care.  To afford each patient quality time with our provider, please arrive at least 15 minutes before your scheduled appointment time.   If you have a lab appointment with the Cancer Center please come in thru the Main Entrance and check in at the main information desk.  You need to re-schedule your appointment should you arrive 10 or more minutes late.  We strive to give you quality time with our providers, and arriving late affects you and other patients whose appointments are after yours.  Also, if you no show three or more times for appointments you may be dismissed from the clinic at the providers discretion.     Again, thank you for choosing [facility].  Our hope is that these requests will decrease the amount of time that you wait before being seen by our physicians.        _____________________________________________________________  Should you have questions after your visit to [facility], please contact our office at [phone] and follow the prompts.  Our office hours are 8:00 a.m. and 4:30 p.m. Monday - Friday.  Please note that voicemails left after 4:00 p.m. may not be returned until the following business day.  We are closed weekends and major holidays.  You do have access to a nurse 24-7, just call the main number to the clinic [phone] and do not press any options, hold on the line and a nurse will answer the phone.    For prescription refill requests, have your pharmacy contact our office and allow 72 hours.    Due to Covid, you will need to wear a mask upon entering the hospital. If you do not have a mask, a mask will be given to you at the Main Entrance upon arrival. For doctor visits, patients may have 1 support person age 61 or older with them. For treatment visits, patients can not have anyone with them due to social distancing guidelines and our immunocompromised population.

## 2021-12-27 NOTE — Progress Notes (Signed)
No phlebotomy today due to hematocrit at 48.1.

## 2022-02-13 DIAGNOSIS — D518 Other vitamin B12 deficiency anemias: Secondary | ICD-10-CM | POA: Diagnosis not present

## 2022-02-13 DIAGNOSIS — E559 Vitamin D deficiency, unspecified: Secondary | ICD-10-CM | POA: Diagnosis not present

## 2022-02-13 DIAGNOSIS — E039 Hypothyroidism, unspecified: Secondary | ICD-10-CM | POA: Diagnosis not present

## 2022-02-13 DIAGNOSIS — E119 Type 2 diabetes mellitus without complications: Secondary | ICD-10-CM | POA: Diagnosis not present

## 2022-02-13 DIAGNOSIS — R7309 Other abnormal glucose: Secondary | ICD-10-CM | POA: Diagnosis not present

## 2022-03-28 ENCOUNTER — Inpatient Hospital Stay: Payer: Medicare Other | Attending: Hematology

## 2022-03-28 ENCOUNTER — Inpatient Hospital Stay: Payer: Medicare Other

## 2022-03-28 DIAGNOSIS — D751 Secondary polycythemia: Secondary | ICD-10-CM | POA: Diagnosis not present

## 2022-03-28 LAB — CBC
HCT: 42.3 % (ref 39.0–52.0)
Hemoglobin: 14.2 g/dL (ref 13.0–17.0)
MCH: 29.5 pg (ref 26.0–34.0)
MCHC: 33.6 g/dL (ref 30.0–36.0)
MCV: 87.8 fL (ref 80.0–100.0)
Platelets: 209 10*3/uL (ref 150–400)
RBC: 4.82 MIL/uL (ref 4.22–5.81)
RDW: 13.2 % (ref 11.5–15.5)
WBC: 7.3 10*3/uL (ref 4.0–10.5)
nRBC: 0 % (ref 0.0–0.2)

## 2022-03-28 NOTE — Progress Notes (Signed)
No phlebotomy today d/t HCT of 42.3.

## 2022-05-02 DIAGNOSIS — F419 Anxiety disorder, unspecified: Secondary | ICD-10-CM | POA: Diagnosis not present

## 2022-05-02 DIAGNOSIS — G20A2 Parkinson's disease without dyskinesia, with fluctuations: Secondary | ICD-10-CM | POA: Diagnosis not present

## 2022-05-02 DIAGNOSIS — I10 Essential (primary) hypertension: Secondary | ICD-10-CM | POA: Diagnosis not present

## 2022-05-23 DIAGNOSIS — H6120 Impacted cerumen, unspecified ear: Secondary | ICD-10-CM | POA: Diagnosis not present

## 2022-05-23 DIAGNOSIS — E6609 Other obesity due to excess calories: Secondary | ICD-10-CM | POA: Diagnosis not present

## 2022-05-23 DIAGNOSIS — R1310 Dysphagia, unspecified: Secondary | ICD-10-CM | POA: Diagnosis not present

## 2022-05-23 DIAGNOSIS — Z6835 Body mass index (BMI) 35.0-35.9, adult: Secondary | ICD-10-CM | POA: Diagnosis not present

## 2022-05-30 DIAGNOSIS — G20A1 Parkinson's disease without dyskinesia, without mention of fluctuations: Secondary | ICD-10-CM | POA: Diagnosis not present

## 2022-05-30 DIAGNOSIS — I10 Essential (primary) hypertension: Secondary | ICD-10-CM | POA: Diagnosis not present

## 2022-05-30 DIAGNOSIS — E119 Type 2 diabetes mellitus without complications: Secondary | ICD-10-CM | POA: Diagnosis not present

## 2022-05-30 DIAGNOSIS — M75101 Unspecified rotator cuff tear or rupture of right shoulder, not specified as traumatic: Secondary | ICD-10-CM | POA: Diagnosis not present

## 2022-05-30 DIAGNOSIS — E6609 Other obesity due to excess calories: Secondary | ICD-10-CM | POA: Diagnosis not present

## 2022-05-30 DIAGNOSIS — Z6835 Body mass index (BMI) 35.0-35.9, adult: Secondary | ICD-10-CM | POA: Diagnosis not present

## 2022-05-30 DIAGNOSIS — R1310 Dysphagia, unspecified: Secondary | ICD-10-CM | POA: Diagnosis not present

## 2022-06-01 ENCOUNTER — Encounter (HOSPITAL_COMMUNITY): Payer: Self-pay | Admitting: Hematology

## 2022-06-03 ENCOUNTER — Encounter (HOSPITAL_COMMUNITY): Payer: Self-pay | Admitting: Hematology

## 2022-06-09 NOTE — H&P (View-Only) (Signed)
  GI Office Note    Referring Provider: Fusco, Lawrence, MD Primary Care Physician:  Fusco, Lawrence, MD  Primary Gastroenterologist: Charles K. Carver, DO  Chief Complaint   Chief Complaint  Patient presents with   Dysphagia    Having a hard time swallowing food.    History of Present Illness   Bralen Cassada is a 67 y.o. male presenting today at the request of Fusco, Lawrence, MD for dysphagia.  Colonoscopy October 2020: -3 cecal polyps -2 distal transverse and descending colon polyps -Pathology: tubular adenoma and sessile serrated adenoma -Repeat in 5 years     Latest Ref Rng & Units 03/28/2022    1:26 PM 12/27/2021   12:42 PM 07/03/2021   12:45 PM  CBC  WBC 4.0 - 10.5 K/uL 7.3  6.6  6.3   Hemoglobin 13.0 - 17.0 g/dL 14.2  15.8  16.2   Hematocrit 39.0 - 52.0 % 42.3  48.1  49.7   Platelets 150 - 400 K/uL 209  251  232                   MBS May 2023: IMPRESSION: Pt presents with normal to mild oropharyngeal dysphagia characterized by slight difficulty with bolus cohesiveness with mixed consistencies (barium tablet with cup thin) where Pt swallowed the thin liquid (head of bolus) and the barium tablet (tail of bolus) remained in valleculae. Pt then swallowed a sip of liquid to clear effectively. Pt exhibited premature spillage with thin liquids via straw sip to the level of the pyriforms resulting in trace penetration and aspiration of thins (spills over arytenoids) just below the vocal folds, but is immediately expelled with a spontaneous cough. Pt's demonstrated normal swallow function and physiology with all other presentations (thin via tsp/cup, puree, and regular textures). Pt was sensate to the brief stasis of barium tablet in the valleculae and he indicates that the sensation is the same as he feels periodically with solids at home. Recommend regular textures and thin liquids and ok for pills with thin and follow with liquid wash, cut pieces of meats and  breads to bite size or chew thoroughly. No dysphagia therapy recommended at this time. The imaging from [facility] was reviewed with Pt and spouse. He reports xerstomia, occasional coughing on his saliva, and exhibited throat clearing during our visit. Pt also noted to have prominent cricopharyngeus, possibly related to reflux. Pt was given reflux and esophageal precautions to follow at home and encouraged to swallow more frequently. He may benefit from PT evaluation due to history of falls with his report of decreased balance in setting of Parkinson's. Pt may also benefit from SLP evaluation for dysarthria given Pt and spousal reports of reduced vocal intensity and occasional decreased speech intelligibility in setting of Parkinson's.   Dysphagia Swallowing has gotten worse since last year. Has parkinson's disease. States he had a BPE done last year. He notes his words are very slurry and at times his voice goes. He does get hoarse at times as well. No N/V. No odynophagia.   Hamburger buns are difficult. Other meats are difficult and has to be cut into small pieces and pills are the worst. All of his medication are small but has to drink large amount of water to get it to go down. Has only had one episode of regurgitation. No epigastric pain.   Started pantoprazole last week and hasn't been able to tell much difference.   He does not feel like the sinemet is helping   much. Continues to feel out of balance at times. He feels as though his tremor is worse. It is with both sides but L>R in his arms.   Current Outpatient Medications  Medication Sig Dispense Refill   atorvastatin (LIPITOR) 40 MG tablet Take 40 mg by mouth daily.     carbidopa-levodopa (SINEMET IR) 25-100 MG tablet Take 1 tablet by mouth 4 (four) times daily.     citalopram (CELEXA) 40 MG tablet Take by mouth.     folic acid (FOLVITE) 1 MG tablet folic acid 1 mg tablet     levothyroxine (SYNTHROID) 25 MCG tablet Take 25 mcg by mouth daily.      Levothyroxine Sodium 25 MCG CAPS Take by mouth.     metoprolol succinate (TOPROL-XL) 50 MG 24 hr tablet metoprolol succinate ER 50 mg tablet,extended release 24 hr     pantoprazole (PROTONIX) 40 MG tablet Take 40 mg by mouth daily.     tadalafil (CIALIS) 20 MG tablet Take 1 tablet (20 mg total) by mouth daily as needed. 10 tablet 5   tadalafil (CIALIS) 5 MG tablet Take 1 tablet (5 mg total) by mouth daily. 30 tablet 11   VITAMIN D, CHOLECALCIFEROL, PO Take by mouth.     amantadine (SYMMETREL) 100 MG capsule Take 100 mg by mouth 2 (two) times daily. (Patient not taking: Reported on 06/11/2022)     aspirin EC 81 MG tablet Take 1 tablet (81 mg total) by mouth daily. Swallow whole. (Patient not taking: Reported on 06/11/2022) 90 tablet 3   gabapentin (NEURONTIN) 300 MG capsule Take 300 mg by mouth daily. (Patient not taking: Reported on 06/11/2022)     testosterone cypionate (DEPOTESTOSTERONE CYPIONATE) 200 MG/ML injection SMARTSIG:Milliliter(s) IM (Patient not taking: Reported on 06/11/2022)     No current facility-administered medications for this visit.    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  Allergies as of 06/11/2022 - Review Complete 06/11/2022  Allergen Reaction Noted   Oxycodone  06/23/2020    Social History   Socioeconomic History   Marital status: Married    Spouse name: Not on file   Number of children: Not on file   Years of education: Not on file   Highest education level: Not on file  Occupational History   Not on file  Tobacco Use   Smoking status: Never   Smokeless tobacco: Never  Substance and Sexual Activity   Alcohol use: Not Currently   Drug use: Not Currently   Sexual activity: Yes  Other Topics Concern   Not  on file  Social History Narrative   Not on file   Social Determinants of Health   Financial Resource Strain: Not on file  Food Insecurity: Not on file  Transportation Needs: Not on file  Physical Activity: Not on file  Stress: Not on file  Social Connections: Not on file  Intimate Partner Violence: Not on file     Review of Systems   Gen: + lightheadedness/vertigo (years). Denies any fever, chills, fatigue, weight loss, lack of appetite.  CV: + bradycardia. Denies chest pain, heart palpitations, peripheral edema, syncope.  Resp: Denies shortness of breath at rest or   with exertion. Denies wheezing or cough.  GI: see HPI GU : Denies urinary burning, urinary frequency, urinary hesitancy MS: + tremors. Denies joint pain, muscle weakness, cramps, or limitation of movement.  Derm: Denies rash, itching, dry skin Psych: + anxiety. Denies depression, memory loss, and confusion Heme: Denies bruising, bleeding, and enlarged lymph nodes.   Physical Exam   BP 129/77 (BP Location: Left Arm, Patient Position: Sitting, Cuff Size: Large)   Pulse (!) 56   Temp 97.6 F (36.4 C) (Temporal)   Ht 5' 8" (1.727 m)   Wt 231 lb 3.2 oz (104.9 kg)   SpO2 95%   BMI 35.15 kg/m   General:   Alert and oriented. Pleasant and cooperative. Well-nourished and well-developed.  Head:  Normocephalic and atraumatic. Eyes:  Without icterus, sclera clear and conjunctiva pink.  Ears:  Normal auditory acuity. Mouth:  No deformity or lesions, oral mucosa pink.  Lungs:  Clear to auscultation bilaterally. No wheezes, rales, or rhonchi. No distress.  Heart:  S1, S2 present without murmurs appreciated.  Mild bradycardia Abdomen:  +BS, soft, non-tender and non-distended. No HSM noted. No guarding or rebound. No masses appreciated.  Rectal:  Deferred Msk:  Symmetrical without gross deformities. Normal posture. Extremities:  Without edema. Neurologic:  Alert and  oriented x4;  grossly normal neurologically.  Mild  dysarthria. Baseline mild tremor to BUE.  Skin:  Intact without significant lesions or rashes. Psych:  Alert and cooperative. Normal mood and affect.   Assessment   Seven Torre is a 66 y.o. male with a history of Parkinson's, anxiety, HTN, HLD, polycythemia, OSA, and hypothyroidism presenting today with   Dysphagia, dysarthria: Symptoms appear to be mixed oropharyngeal and esophageal dysphagia given issues with medications and solids.  Modified barium swallow in May 2023 with normal to mild oropharyngeal dysphagia and had brief stasis of barium tablet in the valleculae.  His diet recommendation at that time with regular textures with thin liquids and okay for pills followed by thin liquid wash and advised small bites of meats and breads and chewing adequately.  I reinforced this today with patient and spouse had also advised a thicker consistency.  Take medications with such as yogurt, pudding, or applesauce.  He has also been experiencing some dysarthria since his diagnosis of Parkinson's just likely contributing to his dysphagia as well.  He will likely benefit from following with speech therapy for his dysarthria regardless of his EGD findings.  We are pursuing EGD with possible dilation to assess for esophageal stricture, stenosis, web, or other.  He will continue on PPI once daily for now.   PLAN   Proceed with upper endoscopy with dilation with propofol by Dr. Carver in near future: the risks, benefits, and alternatives have been discussed with the patient in detail. The patient states understanding and desires to proceed. ASA 3 Continue pantoprazole 40 mg once daily.  Dysphagia precautions advised - use applesauce or yogurt/pudding for medications.  Separate written education provided.  Pending EGD, he will likely benefit from following speech therapy. Follow-up 6 to 8 weeks post procedure.   Adam Demary, MSN, FNP-BC, AGACNP-BC [facility] 

## 2022-06-09 NOTE — Progress Notes (Unsigned)
GI Office Note    Referring Provider: Elfredia Nevins, MD Primary Care Physician:  Elfredia Nevins, MD  Primary Gastroenterologist: Hennie Duos. Marletta Lor, DO  Chief Complaint   Chief Complaint  Patient presents with   Dysphagia    Having a hard time swallowing food.    History of Present Illness   Alex [name] is a 67 y.o. male presenting today at the request of Elfredia Nevins, MD for dysphagia.  Colonoscopy October 2020: -3 cecal polyps -2 distal transverse and descending colon polyps -Pathology: tubular adenoma and sessile serrated adenoma -Repeat in 5 years     Latest Ref Rng & Units 03/28/2022    1:26 PM 12/27/2021   12:42 PM 07/03/2021   12:45 PM  CBC  WBC 4.0 - 10.5 K/uL 7.3  6.6  6.3   Hemoglobin 13.0 - 17.0 g/dL 60.4  54.0  98.1   Hematocrit 39.0 - 52.0 % 42.3  48.1  49.7   Platelets 150 - 400 K/uL 209  251  232                   MBS May 2023: IMPRESSION: Pt presents with normal to mild oropharyngeal dysphagia characterized by slight difficulty with bolus cohesiveness with mixed consistencies (barium tablet with cup thin) where Pt swallowed the thin liquid (head of bolus) and the barium tablet (tail of bolus) remained in valleculae. Pt then swallowed a sip of liquid to clear effectively. Pt exhibited premature spillage with thin liquids via straw sip to the level of the pyriforms resulting in trace penetration and aspiration of thins (spills over arytenoids) just below the vocal folds, but is immediately expelled with a spontaneous cough. Pt's demonstrated normal swallow function and physiology with all other presentations (thin via tsp/cup, puree, and regular textures). Pt was sensate to the brief stasis of barium tablet in the valleculae and he indicates that the sensation is the same as he feels periodically with solids at home. Recommend regular textures and thin liquids and ok for pills with thin and follow with liquid wash, cut pieces of meats and  breads to bite size or chew thoroughly. No dysphagia therapy recommended at this time. The imaging from [facility] was reviewed with Pt and spouse. He reports xerstomia, occasional coughing on his saliva, and exhibited throat clearing during our visit. Pt also noted to have prominent cricopharyngeus, possibly related to reflux. Pt was given reflux and esophageal precautions to follow at home and encouraged to swallow more frequently. He may benefit from PT evaluation due to history of falls with his report of decreased balance in setting of Parkinson's. Pt may also benefit from SLP evaluation for dysarthria given Pt and spousal reports of reduced vocal intensity and occasional decreased speech intelligibility in setting of Parkinson's.   Dysphagia Swallowing has gotten worse since last year. Has parkinson's disease. States he had a BPE done last year. He notes his words are very slurry and at times his voice goes. He does get hoarse at times as well. No N/V. No odynophagia.   Hamburger buns are difficult. Other meats are difficult and has to be cut into small pieces and pills are the worst. All of his medication are small but has to drink large amount of water to get it to go down. Has only had one episode of regurgitation. No epigastric pain.   Started pantoprazole last week and hasn't been able to tell much difference.   He does not feel like the sinemet is helping  much. Continues to feel out of balance at times. He feels as though his tremor is worse. It is with both sides but L>R in his arms.   Current Outpatient Medications  Medication Sig Dispense Refill   atorvastatin (LIPITOR) 40 MG tablet Take 40 mg by mouth daily.     carbidopa-levodopa (SINEMET IR) 25-100 MG tablet Take 1 tablet by mouth 4 (four) times daily.     citalopram (CELEXA) 40 MG tablet Take by mouth.     folic acid (FOLVITE) 1 MG tablet folic acid 1 mg tablet     levothyroxine (SYNTHROID) 25 MCG tablet Take 25 mcg by mouth daily.      Levothyroxine Sodium 25 MCG CAPS Take by mouth.     metoprolol succinate (TOPROL-XL) 50 MG 24 hr tablet metoprolol succinate ER 50 mg tablet,extended release 24 hr     pantoprazole (PROTONIX) 40 MG tablet Take 40 mg by mouth daily.     tadalafil (CIALIS) 20 MG tablet Take 1 tablet (20 mg total) by mouth daily as needed. 10 tablet 5   tadalafil (CIALIS) 5 MG tablet Take 1 tablet (5 mg total) by mouth daily. 30 tablet 11   VITAMIN D, CHOLECALCIFEROL, PO Take by mouth.     amantadine (SYMMETREL) 100 MG capsule Take 100 mg by mouth 2 (two) times daily. (Patient not taking: Reported on 06/11/2022)     aspirin EC 81 MG tablet Take 1 tablet (81 mg total) by mouth daily. Swallow whole. (Patient not taking: Reported on 06/11/2022) 90 tablet 3   gabapentin (NEURONTIN) 300 MG capsule Take 300 mg by mouth daily. (Patient not taking: Reported on 06/11/2022)     testosterone cypionate (DEPOTESTOSTERONE CYPIONATE) 200 MG/ML injection SMARTSIG:Milliliter(s) IM (Patient not taking: Reported on 06/11/2022)     No current facility-administered medications for this visit.    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  Allergies as of 06/11/2022 - Review Complete 06/11/2022  Allergen Reaction Noted   Oxycodone  06/23/2020    Social History   Socioeconomic History   Marital status: Married    Spouse name: Not on file   Number of children: Not on file   Years of education: Not on file   Highest education level: Not on file  Occupational History   Not on file  Tobacco Use   Smoking status: Never   Smokeless tobacco: Never  Substance and Sexual Activity   Alcohol use: Not Currently   Drug use: Not Currently   Sexual activity: Yes  Other Topics Concern   Not  on file  Social History Narrative   Not on file   Social Determinants of Health   Financial Resource Strain: Not on file  Food Insecurity: Not on file  Transportation Needs: Not on file  Physical Activity: Not on file  Stress: Not on file  Social Connections: Not on file  Intimate Partner Violence: Not on file     Review of Systems   Gen: + lightheadedness/vertigo (years). Denies any fever, chills, fatigue, weight loss, lack of appetite.  CV: + bradycardia. Denies chest pain, heart palpitations, peripheral edema, syncope.  Resp: Denies shortness of breath at rest or  with exertion. Denies wheezing or cough.  GI: see HPI GU : Denies urinary burning, urinary frequency, urinary hesitancy MS: + tremors. Denies joint pain, muscle weakness, cramps, or limitation of movement.  Derm: Denies rash, itching, dry skin Psych: + anxiety. Denies depression, memory loss, and confusion Heme: Denies bruising, bleeding, and enlarged lymph nodes.   Physical Exam   BP 129/77 (BP Location: Left Arm, Patient Position: Sitting, Cuff Size: Large)   Pulse (!) 56   Temp 97.6 F (36.4 C) (Temporal)   Ht 5\' 8"  (1.727 m)   Wt 231 lb 3.2 oz (104.9 kg)   SpO2 95%   BMI 35.15 kg/m   General:   Alert and oriented. Pleasant and cooperative. Well-nourished and well-developed.  Head:  Normocephalic and atraumatic. Eyes:  Without icterus, sclera clear and conjunctiva pink.  Ears:  Normal auditory acuity. Mouth:  No deformity or lesions, oral mucosa pink.  Lungs:  Clear to auscultation bilaterally. No wheezes, rales, or rhonchi. No distress.  Heart:  S1, S2 present without murmurs appreciated.  Mild bradycardia Abdomen:  +BS, soft, non-tender and non-distended. No HSM noted. No guarding or rebound. No masses appreciated.  Rectal:  Deferred Msk:  Symmetrical without gross deformities. Normal posture. Extremities:  Without edema. Neurologic:  Alert and  oriented x4;  grossly normal neurologically.  Mild  dysarthria. Baseline mild tremor to BUE.  Skin:  Intact without significant lesions or rashes. Psych:  Alert and cooperative. Normal mood and affect.   Assessment   Bron Aarons is a 67 y.o. male with a history of Parkinson's, anxiety, HTN, HLD, polycythemia, OSA, and hypothyroidism presenting today with   Dysphagia, dysarthria: Symptoms appear to be mixed oropharyngeal and esophageal dysphagia given issues with medications and solids.  Modified barium swallow in May 2023 with normal to mild oropharyngeal dysphagia and had brief stasis of barium tablet in the valleculae.  His diet recommendation at that time with regular textures with thin liquids and okay for pills followed by thin liquid wash and advised small bites of meats and breads and chewing adequately.  I reinforced this today with patient and spouse had also advised a thicker consistency.  Take medications with such as yogurt, pudding, or applesauce.  He has also been experiencing some dysarthria since his diagnosis of Parkinson's just likely contributing to his dysphagia as well.  He will likely benefit from following with speech therapy for his dysarthria regardless of his EGD findings.  We are pursuing EGD with possible dilation to assess for esophageal stricture, stenosis, web, or other.  He will continue on PPI once daily for now.   PLAN   Proceed with upper endoscopy with dilation with propofol by Dr. Marletta Lor in near future: the risks, benefits, and alternatives have been discussed with the patient in detail. The patient states understanding and desires to proceed. ASA 3 Continue pantoprazole 40 mg once daily.  Dysphagia precautions advised - use applesauce or yogurt/pudding for medications.  Separate written education provided.  Pending EGD, he will likely benefit from following speech therapy. Follow-up 6 to 8 weeks post procedure.   Alex Bonito, MSN, FNP-BC, AGACNP-BC [facility]

## 2022-06-11 ENCOUNTER — Encounter: Payer: Self-pay | Admitting: Gastroenterology

## 2022-06-11 ENCOUNTER — Encounter: Payer: Self-pay | Admitting: *Deleted

## 2022-06-11 ENCOUNTER — Telehealth: Payer: Self-pay | Admitting: *Deleted

## 2022-06-11 ENCOUNTER — Ambulatory Visit: Payer: Medicare PPO | Admitting: Gastroenterology

## 2022-06-11 VITALS — BP 129/77 | HR 56 | Temp 97.6°F | Ht 68.0 in | Wt 231.2 lb

## 2022-06-11 DIAGNOSIS — R471 Dysarthria and anarthria: Secondary | ICD-10-CM | POA: Diagnosis not present

## 2022-06-11 DIAGNOSIS — R1319 Other dysphagia: Secondary | ICD-10-CM

## 2022-06-11 NOTE — Telephone Encounter (Signed)
Called pt and he is aware of pre-op appt details °

## 2022-06-11 NOTE — Telephone Encounter (Signed)
PA submitted via cohere. AUTH APPROVED. Authorization #[id number] , DOS:  06/14/2022 - 08/14/2022

## 2022-06-11 NOTE — Patient Instructions (Signed)
Continue taking your pantoprazole 40 mg once daily.  Dysphagia precautions: Remain upright while eating and for 30 minutes after meals. Cut meats and vegetables into small bites, chewing adequately prior to swallowing.  Alternate with small sips of liquids in between bites. Please take your medications if you are having difficulty swallowing with applesauce/yogurt/pudding.  We are scheduling you for an upper endoscopy with possible dilation in the near future with Dr. Marletta Lor.  We will plan to follow-up about 6 weeks after your procedure to ensure things are improving.  If you have any questions or concerns between now and then please also take to reach out to the office or send a MyChart message.  It was a pleasure to see you today. I want to create trusting relationships with patients. If you receive a survey regarding your visit,  I greatly appreciate you taking time to fill this out on paper or through your MyChart. I value your feedback.  Brooke Bonito, MSN, FNP-BC, AGACNP-BC [facility]

## 2022-06-11 NOTE — Patient Instructions (Signed)
Alex [name]  06/11/2022     @PREFPERIOPPHARMACY @   Your procedure is scheduled on 06/14/22.  Report to [facility] at 0715 A.M.  Call this number if you have problems the morning of surgery:  [phone]  If you experience any cold or flu symptoms such as cough, fever, chills, shortness of breath, etc. between now and your scheduled surgery, please notify us at the above number.   Remember:  Follow diet instructions given to you by Dr. Darolyn Rua office    Take these medicines the morning of surgery with A SIP OF WATER Sinemet, Celexa, Synthroid, Metoprolol, Protonix    Do not wear jewelry, make-up or nail polish, including gel polish,  artificial nails, or any other type of covering on natural nails (fingers and  toes).  Do not wear lotions, powders, or perfumes, or deodorant.  Do not shave 48 hours prior to surgery.  Men may shave face and neck.  Do not bring valuables to the hospital.  [facility] is not responsible for any belongings or valuables.  Contacts, dentures or bridgework may not be worn into surgery.  Leave your suitcase in the car.  After surgery it may be brought to your room.  For patients admitted to the hospital, discharge time will be determined by your treatment team.  Patients discharged the day of surgery will not be allowed to drive home.   Name and phone number of your driver:   family Special instructions:  NA  Please read over the following fact sheets that you were given. Care and Recovery After Surgery  Upper Endoscopy, Adult Upper endoscopy is a procedure to look inside the upper GI (gastrointestinal) tract. The upper GI tract is made up of: The esophagus. This is the part of the body that moves food from your mouth to your stomach. The stomach. The duodenum. This is the first part of your small intestine. This procedure is also called esophagogastroduodenoscopy (EGD) or gastroscopy. In this procedure, your health care provider passes a thin,  flexible tube (endoscope) through your mouth and down your esophagus into your stomach and into your duodenum. A small camera is attached to the end of the tube. Images from the camera appear on a monitor in the exam room. During this procedure, your health care provider may also remove a small piece of tissue to be sent to a lab and examined under a microscope (biopsy). Your health care provider may do an upper endoscopy to diagnose cancers of the upper GI tract. You may also have this procedure to find the cause of other conditions, such as: Stomach pain. Heartburn. Pain or problems when swallowing. Nausea and vomiting. Stomach bleeding. Stomach ulcers. Tell a health care provider about: Any allergies you have. All medicines you are taking, including vitamins, herbs, eye drops, creams, and over-the-counter medicines. Any problems you or family members have had with anesthetic medicines. Any bleeding problems you have. Any surgeries you have had. Any medical conditions you have. Whether you are pregnant or may be pregnant. What are the risks? Your healthcare provider will talk with you about risks. These may include: Infection. Bleeding. Allergic reactions to medicines. A tear or hole (perforation) in the esophagus, stomach, or duodenum. What happens before the procedure? When to stop eating and drinking Follow instructions from your health care provider about what you may eat and drink. These may include: 8 hours before your procedure Stop eating most foods. Do not eat meat, fried foods, or fatty foods. Eat  only light foods, such as toast or crackers. All liquids are okay except energy drinks and alcohol. 6 hours before your procedure Stop eating. Drink only clear liquids, such as water, clear fruit juice, black coffee, plain tea, and sports drinks. Do not drink energy drinks or alcohol. 2 hours before your procedure Stop drinking all liquids. You may be allowed to take medicines  with small sips of water. If you do not follow your health care provider's instructions, your procedure may be delayed or canceled. Medicines Ask your health care provider about: Changing or stopping your regular medicines. This is especially important if you are taking diabetes medicines or blood thinners. Taking medicines such as aspirin and ibuprofen. These medicines can thin your blood. Do not take these medicines unless your health care provider tells you to take them. Taking over-the-counter medicines, vitamins, herbs, and supplements. General instructions If you will be going home right after the procedure, plan to have a responsible adult: Take you home from the hospital or clinic. You will not be allowed to drive. Care for you for the time you are told. What happens during the procedure?  An IV will be inserted into one of your veins. You may be given one or more of the following: A medicine to help you relax (sedative). A medicine to numb the throat (local anesthetic). You will lie on your left side on an exam table. Your health care provider will pass the endoscope through your mouth and down your esophagus. Your health care provider will use the scope to check the inside of your esophagus, stomach, and duodenum. Biopsies may be taken. The endoscope will be removed. The procedure may vary among health care providers and hospitals. What happens after the procedure? Your blood pressure, heart rate, breathing rate, and blood oxygen level will be monitored until you leave the hospital or clinic. When your throat is no longer numb, you may be given some fluids to drink. If you were given a sedative during the procedure, it can affect you for several hours. Do not drive or operate machinery until your health care provider says that it is safe. It is up to you to get the results of your procedure. Ask your health care provider, or the department that is doing the procedure, when your  results will be ready. Contact a health care provider if you: Have a sore throat that lasts longer than 1 day. Have a fever. Get help right away if you: Vomit blood or your vomit looks like coffee grounds. Have bloody, black, or tarry stools. Have a very bad sore throat or you cannot swallow. Have difficulty breathing or very bad pain in your chest or abdomen. These symptoms may be an emergency. Get help right away. Call 911. Do not wait to see if the symptoms will go away. Do not drive yourself to the hospital. Summary Upper endoscopy is a procedure to look inside the upper GI tract. During the procedure, an IV will be inserted into one of your veins. You may be given a medicine to help you relax. The endoscope will be passed through your mouth and down your esophagus. Follow instructions from your health care provider about what you can eat and drink. This information is not intended to replace advice given to you by your health care provider. Make sure you discuss any questions you have with your health care provider. Document Revised: 04/11/2021 Document Reviewed: 04/11/2021 Elsevier Patient Education  2024 [employer] Inc[employer]  Monitored Anesthesia Care,  Care After The following information offers guidance on how to care for yourself after your procedure. Your health care provider may also give you more specific instructions. If you have problems or questions, contact your health care provider. What can I expect after the procedure? After the procedure, it is common to have: Tiredness. Little or no memory about what happened during or after the procedure. Impaired judgment when it comes to making decisions. Nausea or vomiting. Some trouble with balance. Follow these instructions at home: For the time period you were told by your health care provider:  Rest. Do not participate in activities where you could fall or become injured. Do not drive or use machinery. Do not drink  alcohol. Do not take sleeping pills or medicines that cause drowsiness. Do not make important decisions or sign legal documents. Do not take care of children on your own. Medicines Take over-the-counter and prescription medicines only as told by your health care provider. If you were prescribed antibiotics, take them as told by your health care provider. Do not stop using the antibiotic even if you start to feel better. Eating and drinking Follow instructions from your health care provider about what you may eat and drink. Drink enough fluid to keep your urine pale yellow. If you vomit: Drink clear fluids slowly and in small amounts as you are able. Clear fluids include water, ice chips, low-calorie sports drinks, and fruit juice that has water added to it (diluted fruit juice). Eat light and bland foods in small amounts as you are able. These foods include bananas, applesauce, rice, lean meats, toast, and crackers. General instructions  Have a responsible adult stay with you for the time you are told. It is important to have someone help care for you until you are awake and alert. If you have sleep apnea, surgery and some medicines can increase your risk for breathing problems. Follow instructions from your health care provider about wearing your sleep device: When you are sleeping. This includes during daytime naps. While taking prescription pain medicines, sleeping medicines, or medicines that make you drowsy. Do not use any products that contain nicotine or tobacco. These products include cigarettes, chewing tobacco, and vaping devices, such as e-cigarettes. If you need help quitting, ask your health care provider. Contact a health care provider if: You feel nauseous or vomit every time you eat or drink. You feel light-headed. You are still sleepy or having trouble with balance after 24 hours. You get a rash. You have a fever. You have redness or swelling around the IV site. Get help  right away if: You have trouble breathing. You have new confusion after you get home. These symptoms may be an emergency. Get help right away. Call 911. Do not wait to see if the symptoms will go away. Do not drive yourself to the hospital. This information is not intended to replace advice given to you by your health care provider. Make sure you discuss any questions you have with your health care provider. Document Revised: 05/28/2021 Document Reviewed: 05/28/2021 Elsevier Patient Education  2024 [employer].

## 2022-06-13 ENCOUNTER — Encounter (HOSPITAL_COMMUNITY): Payer: Self-pay

## 2022-06-13 ENCOUNTER — Other Ambulatory Visit: Payer: Self-pay

## 2022-06-13 ENCOUNTER — Encounter (HOSPITAL_COMMUNITY)
Admission: RE | Admit: 2022-06-13 | Discharge: 2022-06-13 | Disposition: A | Discharge status: Home / Self Care | Payer: Medicare PPO | Source: Ambulatory Visit | Attending: Internal Medicine | Admitting: Internal Medicine

## 2022-06-13 DIAGNOSIS — Z01818 Encounter for other preprocedural examination: Secondary | ICD-10-CM | POA: Insufficient documentation

## 2022-06-13 HISTORY — DX: Dyspnea, unspecified: R06.00

## 2022-06-13 HISTORY — DX: Gastro-esophageal reflux disease without esophagitis: K21.9

## 2022-06-14 ENCOUNTER — Ambulatory Visit (HOSPITAL_COMMUNITY)
Admission: RE | Admit: 2022-06-14 | Discharge: 2022-06-14 | Disposition: A | Discharge status: Home / Self Care | Payer: Medicare PPO | Attending: Internal Medicine | Admitting: Internal Medicine

## 2022-06-14 ENCOUNTER — Encounter (HOSPITAL_COMMUNITY): Payer: Self-pay

## 2022-06-14 ENCOUNTER — Encounter (HOSPITAL_COMMUNITY)
Admission: RE | Disposition: A | Discharge status: Home / Self Care | Payer: Self-pay | Source: Home / Self Care | Attending: Internal Medicine

## 2022-06-14 ENCOUNTER — Ambulatory Visit (HOSPITAL_COMMUNITY): Payer: Medicare PPO | Admitting: Anesthesiology

## 2022-06-14 ENCOUNTER — Ambulatory Visit (HOSPITAL_BASED_OUTPATIENT_CLINIC_OR_DEPARTMENT_OTHER): Payer: Medicare PPO | Admitting: Anesthesiology

## 2022-06-14 DIAGNOSIS — G20A1 Parkinson's disease without dyskinesia, without mention of fluctuations: Secondary | ICD-10-CM | POA: Insufficient documentation

## 2022-06-14 DIAGNOSIS — E7800 Pure hypercholesterolemia, unspecified: Secondary | ICD-10-CM | POA: Diagnosis not present

## 2022-06-14 DIAGNOSIS — D751 Secondary polycythemia: Secondary | ICD-10-CM | POA: Insufficient documentation

## 2022-06-14 DIAGNOSIS — K219 Gastro-esophageal reflux disease without esophagitis: Secondary | ICD-10-CM | POA: Insufficient documentation

## 2022-06-14 DIAGNOSIS — I10 Essential (primary) hypertension: Secondary | ICD-10-CM

## 2022-06-14 DIAGNOSIS — Z79899 Other long term (current) drug therapy: Secondary | ICD-10-CM | POA: Diagnosis not present

## 2022-06-14 DIAGNOSIS — K2950 Unspecified chronic gastritis without bleeding: Secondary | ICD-10-CM

## 2022-06-14 DIAGNOSIS — F419 Anxiety disorder, unspecified: Secondary | ICD-10-CM | POA: Diagnosis not present

## 2022-06-14 DIAGNOSIS — K2970 Gastritis, unspecified, without bleeding: Secondary | ICD-10-CM | POA: Insufficient documentation

## 2022-06-14 DIAGNOSIS — G4733 Obstructive sleep apnea (adult) (pediatric): Secondary | ICD-10-CM | POA: Diagnosis not present

## 2022-06-14 DIAGNOSIS — R1312 Dysphagia, oropharyngeal phase: Secondary | ICD-10-CM | POA: Diagnosis not present

## 2022-06-14 DIAGNOSIS — K259 Gastric ulcer, unspecified as acute or chronic, without hemorrhage or perforation: Secondary | ICD-10-CM | POA: Diagnosis not present

## 2022-06-14 HISTORY — PX: ESOPHAGOGASTRODUODENOSCOPY (EGD) WITH PROPOFOL: SHX5813

## 2022-06-14 HISTORY — PX: BIOPSY: SHX5522

## 2022-06-14 SURGERY — ESOPHAGOGASTRODUODENOSCOPY (EGD) WITH PROPOFOL
Anesthesia: General

## 2022-06-14 MED ORDER — LACTATED RINGERS IV SOLN: INTRAVENOUS | Status: DC

## 2022-06-14 MED ORDER — LIDOCAINE HCL (CARDIAC) PF 100 MG/5ML IV SOSY
PREFILLED_SYRINGE | INTRAVENOUS | Status: DC | PRN
  Administered 2022-06-14: 50 mg via INTRAVENOUS

## 2022-06-14 MED ORDER — PROPOFOL 10 MG/ML IV BOLUS
INTRAVENOUS | Status: DC | PRN
  Administered 2022-06-14: 80 mg via INTRAVENOUS
  Administered 2022-06-14: 50 mg via INTRAVENOUS
  Administered 2022-06-14: 40 mg via INTRAVENOUS

## 2022-06-14 NOTE — Transfer of Care (Signed)
Immediate Anesthesia Transfer of Care Note  Patient: Alex [name]  Procedure(s) Performed: ESOPHAGOGASTRODUODENOSCOPY (EGD) WITH PROPOFOL BIOPSY  Patient Location: Short Stay  Anesthesia Type:General  Level of Consciousness: drowsy  Airway & Oxygen Therapy: Patient Spontanous Breathing  Post-op Assessment: Report given to RN and Post -op Vital signs reviewed and stable  Post vital signs: Reviewed and stable  Last Vitals:  Vitals Value Taken Time  BP 105/72 06/14/22 0919  Temp 36.2 C 06/14/22 0919  Pulse 45 06/14/22 0919  Resp 14   SpO2 98 % 06/14/22 0919    Last Pain:  Vitals:   06/14/22 0919  TempSrc: Oral  PainSc: Asleep         Complications: No notable events documented.

## 2022-06-14 NOTE — Anesthesia Preprocedure Evaluation (Signed)
Anesthesia Evaluation  Patient identified by MRN, date of birth, ID band Patient awake    Reviewed: Allergy & Precautions, H&P , NPO status , Patient's Chart, lab work & pertinent test results, reviewed documented beta blocker date and time   Airway Mallampati: II  TM Distance: >3 FB Neck ROM: full    Dental no notable dental hx.    Pulmonary neg pulmonary ROS   Pulmonary exam normal breath sounds clear to auscultation       Cardiovascular Exercise Tolerance: Good hypertension, negative cardio ROS  Rhythm:regular Rate:Normal     Neuro/Psych   Anxiety     negative neurological ROS  negative psych ROS   GI/Hepatic Neg liver ROS,GERD  Medicated,,  Endo/Other  negative endocrine ROS    Renal/GU negative Renal ROS  negative genitourinary   Musculoskeletal   Abdominal   Peds  Hematology negative hematology ROS (+)   Anesthesia Other Findings   Reproductive/Obstetrics negative OB ROS                             Anesthesia Physical Anesthesia Plan  ASA: 2  Anesthesia Plan: General   Post-op Pain Management:    Induction:   PONV Risk Score and Plan: Propofol infusion  Airway Management Planned:   Additional Equipment:   Intra-op Plan:   Post-operative Plan:   Informed Consent: I have reviewed the patients History and Physical, chart, labs and discussed the procedure including the risks, benefits and alternatives for the proposed anesthesia with the patient or authorized representative who has indicated his/her understanding and acceptance.     Dental Advisory Given  Plan Discussed with: CRNA  Anesthesia Plan Comments:        Anesthesia Quick Evaluation

## 2022-06-14 NOTE — Op Note (Signed)
[facility] Patient Name: Alex [name] Procedure Date: 06/14/2022 9:03 AM MRN: [id number] Date of Birth: 1955/11/13 Attending MD: Hennie Duos. Marletta Lor , [place], [id number] CSN: [id number] Age: 67 Admit Type: Outpatient Procedure:                Upper GI endoscopy Indications:              Dysphagia Providers:                Hennie Duos. Marletta Lor, DO, Jannett Celestine, RN, Crystal Page Referring MD:              Medicines:                See the Anesthesia note for documentation of the                            administered medications Complications:            No immediate complications. Estimated Blood Loss:     Estimated blood loss was minimal. Procedure:                Pre-Anesthesia Assessment:                           - The anesthesia plan was to use monitored                            anesthesia care (MAC).                           After obtaining informed consent, the endoscope was                            passed under direct vision. Throughout the                            procedure, the patient's blood pressure, pulse, and                            oxygen saturations were monitored continuously. The                            GIF-H190 ([id number]) scope was introduced through the                            mouth, and advanced to the second part of duodenum.                            The upper GI endoscopy was accomplished without                            difficulty. The patient tolerated the procedure                            well. Scope In: 9:13:44 AM Scope Out: 9:16:57 AM Total Procedure Duration: 0 hours 3 minutes 13 seconds  Findings:      There is no endoscopic evidence of stenosis or stricture  in the entire       esophagus.      Diffuse mild inflammation characterized by erythema was found in the       entire examined stomach. Biopsies were taken with a cold forceps for       Helicobacter pylori testing.      The duodenal bulb, first portion of the duodenum and  second portion of       the duodenum were normal. Impression:               - Gastritis. Biopsied.                           - Normal duodenal bulb, first portion of the                            duodenum and second portion of the duodenum. Moderate Sedation:      Per Anesthesia Care Recommendation:           - Patient has a contact number available for                            emergencies. The signs and symptoms of potential                            delayed complications were discussed with the                            patient. Return to normal activities tomorrow.                            Written discharge instructions were provided to the                            patient.                           - Resume previous diet.                           - Continue present medications.                           - Await pathology results.                           - Use a proton pump inhibitor PO daily.                           - Return to GI clinic in 8 weeks. Procedure Code(s):        --- Professional ---                           [phone], Esophagogastroduodenoscopy, flexible,                            transoral; with biopsy, single or multiple Diagnosis Code(s):        --- Professional ---  K29.70, Gastritis, unspecified, without bleeding                           R13.10, Dysphagia, unspecified CPT copyright 2022 American Medical Association. All rights reserved. The codes documented in this report are preliminary and upon coder review may  be revised to meet current compliance requirements. Hennie Duos. Marletta Lor, DO Hennie Duos. Marletta Lor, DO 06/14/2022 9:21:17 AM This report has been signed electronically. Number of Addenda: 0

## 2022-06-14 NOTE — Interval H&P Note (Signed)
History and Physical Interval Note:  06/14/2022 9:01 AM  Alex [name]  has presented today for surgery, with the diagnosis of DYSPHAGIA.  The various methods of treatment have been discussed with the patient and family. After consideration of risks, benefits and other options for treatment, the patient has consented to  Procedure(s) with comments: ESOPHAGOGASTRODUODENOSCOPY (EGD) WITH PROPOFOL (N/A) - 915AM, ASA 3 BALLOON DILATION (N/A) as a surgical intervention.  The patient's history has been reviewed, patient examined, no change in status, stable for surgery.  I have reviewed the patient's chart and labs.  Questions were answered to the patient's satisfaction.     Lanelle Bal

## 2022-06-14 NOTE — Discharge Instructions (Addendum)
EGD Discharge instructions Please read the instructions outlined below and refer to this sheet in the next few weeks. These discharge instructions provide you with general information on caring for yourself after you leave the hospital. Your doctor may also give you specific instructions. While your treatment has been planned according to the most current medical practices available, unavoidable complications occasionally occur. If you have any problems or questions after discharge, please call your doctor. ACTIVITY You may resume your regular activity but move at a slower pace for the next 24 hours.  Take frequent rest periods for the next 24 hours.  Walking will help expel (get rid of) the air and reduce the bloated feeling in your abdomen.  No driving for 24 hours (because of the anesthesia (medicine) used during the test).  You may shower.  Do not sign any important legal documents or operate any machinery for 24 hours (because of the anesthesia used during the test).  NUTRITION Drink plenty of fluids.  You may resume your normal diet.  Begin with a light meal and progress to your normal diet.  Avoid alcoholic beverages for 24 hours or as instructed by your caregiver.  MEDICATIONS You may resume your normal medications unless your caregiver tells you otherwise.  WHAT YOU CAN EXPECT TODAY You may experience abdominal discomfort such as a feeling of fullness or "gas" pains.  FOLLOW-UP Your doctor will discuss the results of your test with you.  SEEK IMMEDIATE MEDICAL ATTENTION IF ANY OF THE FOLLOWING OCCUR: Excessive nausea (feeling sick to your stomach) and/or vomiting.  Severe abdominal pain and distention (swelling).  Trouble swallowing.  Temperature over 101 F (37.8 C).  Rectal bleeding or vomiting of blood.   Your esophagus was wide open today.  I did not need to stretch it out.  Your EGD revealed mild amount inflammation in your stomach.  I took biopsies of this to rule out  infection with a bacteria called H. pylori.  Await pathology results, my office will contact you.  Small bowel appeared normal.  Continue on pantoprazole daily.  Follow-up in GI office in 6 to 8 weeks.   I hope you have a great rest of your week!  Hennie Duos. Marletta Lor, D.O. Gastroenterology and Hepatology [facility]

## 2022-06-19 ENCOUNTER — Encounter (HOSPITAL_COMMUNITY): Payer: Self-pay | Admitting: Internal Medicine

## 2022-06-20 ENCOUNTER — Telehealth: Payer: Self-pay | Admitting: Internal Medicine

## 2022-06-20 DIAGNOSIS — M25511 Pain in right shoulder: Secondary | ICD-10-CM | POA: Insufficient documentation

## 2022-06-20 DIAGNOSIS — M25512 Pain in left shoulder: Secondary | ICD-10-CM | POA: Diagnosis not present

## 2022-06-20 DIAGNOSIS — T148XXA Other injury of unspecified body region, initial encounter: Secondary | ICD-10-CM | POA: Diagnosis not present

## 2022-06-20 LAB — SURGICAL PATHOLOGY

## 2022-06-20 MED ORDER — TETRACYCLINE HCL 500 MG PO CAPS: 500.0000 mg | ORAL_CAPSULE | Freq: Four times a day (QID) | ORAL | 0 refills | Status: AC

## 2022-06-20 MED ORDER — PANTOPRAZOLE SODIUM 40 MG PO TBEC: 40.0000 mg | DELAYED_RELEASE_TABLET | Freq: Two times a day (BID) | ORAL | 11 refills | Status: DC

## 2022-06-20 MED ORDER — BISMUTH 262 MG PO CHEW: 2.0000 | CHEWABLE_TABLET | Freq: Four times a day (QID) | ORAL | 0 refills | Status: AC

## 2022-06-20 MED ORDER — METRONIDAZOLE 500 MG PO TABS: 500.0000 mg | ORAL_TABLET | Freq: Three times a day (TID) | ORAL | 0 refills | Status: AC

## 2022-06-20 NOTE — Anesthesia Postprocedure Evaluation (Signed)
Anesthesia Post Note  Patient: Alex [name]  Procedure(s) Performed: ESOPHAGOGASTRODUODENOSCOPY (EGD) WITH PROPOFOL BIOPSY  Patient location during evaluation: Phase II Anesthesia Type: General Level of consciousness: awake Pain management: pain level controlled Vital Signs Assessment: post-procedure vital signs reviewed and stable Respiratory status: spontaneous breathing and respiratory function stable Cardiovascular status: blood pressure returned to baseline and stable Postop Assessment: no headache and no apparent nausea or vomiting Anesthetic complications: no Comments: Late entry   No notable events documented.   Last Vitals:  Vitals:   06/14/22 0830 06/14/22 0919  BP: (!) 154/79 105/72  Pulse:  (!) 45  Resp:    Temp:  (!) 36.2 C  SpO2:  98%    Last Pain:  Vitals:   06/17/22 1235  TempSrc:   PainSc: 0-No pain                 Windell Norfolk

## 2022-06-20 NOTE — Telephone Encounter (Signed)
Patient has H. pylori gastritis.  I will send in 14-day course of tetracycline 500 mg 4 times daily, metronidazole 500 mg 3 times daily.   I will also increase pantoprazole to 40 mg twice daily.   I will also send in bismuth to take 4 times daily as well.    Follow-up as previously scheduled to discuss eradication testing.  Thank you  

## 2022-06-20 NOTE — Telephone Encounter (Signed)
Phoned and advised the pt of his result note, instructions, medication use and to follow up. Pt expressed understanding

## 2022-06-21 DIAGNOSIS — M75101 Unspecified rotator cuff tear or rupture of right shoulder, not specified as traumatic: Secondary | ICD-10-CM | POA: Diagnosis not present

## 2022-06-21 DIAGNOSIS — R001 Bradycardia, unspecified: Secondary | ICD-10-CM | POA: Diagnosis not present

## 2022-06-21 DIAGNOSIS — E119 Type 2 diabetes mellitus without complications: Secondary | ICD-10-CM | POA: Diagnosis not present

## 2022-06-21 DIAGNOSIS — E6609 Other obesity due to excess calories: Secondary | ICD-10-CM | POA: Diagnosis not present

## 2022-06-21 DIAGNOSIS — I10 Essential (primary) hypertension: Secondary | ICD-10-CM | POA: Diagnosis not present

## 2022-06-21 DIAGNOSIS — E039 Hypothyroidism, unspecified: Secondary | ICD-10-CM | POA: Diagnosis not present

## 2022-06-21 DIAGNOSIS — W57XXXA Bitten or stung by nonvenomous insect and other nonvenomous arthropods, initial encounter: Secondary | ICD-10-CM | POA: Diagnosis not present

## 2022-06-21 DIAGNOSIS — G20A1 Parkinson's disease without dyskinesia, without mention of fluctuations: Secondary | ICD-10-CM | POA: Diagnosis not present

## 2022-06-21 DIAGNOSIS — R1310 Dysphagia, unspecified: Secondary | ICD-10-CM | POA: Diagnosis not present

## 2022-06-30 NOTE — Progress Notes (Unsigned)
[facility] [address], [place] [address]   CLINIC:  Medical Oncology/Hematology  PCP:  Assunta Found, MD [address] [place] [address] [phone]   REASON FOR VISIT:  Follow-up for secondary polycythemia   PRIOR THERAPY: None   CURRENT THERAPY: Intermittent phlebotomy  INTERVAL HISTORY:   Alex [name] 67 y.o. male returns for routine follow-up of secondary polycythemia in the setting of testosterone injections.  He was last seen by Rojelio Brenner PA-C on 07/03/2021.     At today's visit, he reports feeling fair.  No recent hospitalizations, surgeries, or changes in baseline health status.  He continues to report weakness, lightheadedness, tremors, difficulty swallowing which he attributes to his Parkinson's disease.  He has not had any blood donation or phlebotomy since 05/03/2021.  He reports that he stopped taking testosterone near the end of 2023.  Chronic fatigue and baseline blurry vision are unchanged.  Continues to have chronic headaches.   He denies any strokelike symptoms, tinnitus, aquagenic pruritus, or erythromelalgia.  He is taking aspirin 81 mg daily.  He has little to no energy and 100% appetite. He endorses that he is maintaining a stable weight.  ASSESSMENT & PLAN:  1.  Secondary polycythemia  - Seen at the request of Dr. Sherwood Gambler. Labs sent from PCP on 01/27/2021 showed Hgb 17.9, HCT 53.1, and RBC 5.91 - Received testosterone 400 mg every 2 weeks x 12 years.  Stopped testosterone injections in the fall 2023. - He is a lifelong non-smoker. - No family history of polycythemia or hemochromatosis. 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  He has chronic fatigue, blurry vision, and headaches. - We performed therapeutic  phlebotomy on 03/08/2021 and 05/03/2021, due to high symptom burden. - Most recent CBC (07/01/2022): Shows normal hemoglobin 13.7/hematocrit 41.5%. - Discussed that indications for phlebotomy is indicated for secondary polycythemia in cases of severe symptoms (strokelike symptoms, recurrent headaches, severe fatigue) or if HCT is greater than 54 - Differential diagnosis favors secondary polycythemia secondary to testosterone supplementation - PLAN: Blood counts have normalized and he is no longer on testosterone supplementation.  Will discharge from clinic at this time, but he should return to see [place] if he develops any elevation in his blood counts despite not being on testosterone, or if he goes back on testosterone therapy.   2.  Weight loss   - Patient had 20 to 30 pound weight loss in the past year due to trouble swallowing from Parkinson's disease.  He is on treatment for Parkinson's disease. - Weight has been stable over the past 3 months   3.  Social/family history: - He is a retired [employer].  No smoking history. - No family history of polycythemia.  Paternal grandmother had stomach cancer.     PLAN SUMMARY: >> Tentative discharge from clinic     REVIEW OF SYSTEMS:   Review of Systems  Constitutional:  Positive for fatigue. Negative for appetite change, chills, diaphoresis, fever and unexpected weight change.  HENT:   Positive for trouble swallowing. Negative for lump/mass and nosebleeds.   Eyes:  Negative for eye problems.  Respiratory:  Positive for cough. Negative for hemoptysis and shortness of breath.   Cardiovascular:  Negative for chest pain, leg swelling and palpitations.  Gastrointestinal:  Positive for constipation. Negative for abdominal pain, blood in  stool, diarrhea, nausea and vomiting.  Genitourinary:  Negative for hematuria.   Musculoskeletal:  Positive for arthralgias.  Skin: Negative.   Neurological:  Positive for dizziness and headaches. Negative for  light-headedness.  Hematological:  Does not bruise/bleed easily.     PHYSICAL EXAM:  ECOG PERFORMANCE STATUS: 1 - Symptomatic but completely ambulatory  There were no vitals filed for this visit. There were no vitals filed for this visit. Physical Exam Constitutional:      Appearance: Normal appearance. He is obese.  Cardiovascular:     Heart sounds: Normal heart sounds.  Pulmonary:     Breath sounds: Normal breath sounds.  Neurological:     General: No focal deficit present.     Mental Status: Mental status is at baseline.     Motor: Tremor (faint) present.  Psychiatric:        Behavior: Behavior normal. Behavior is cooperative.     PAST MEDICAL/SURGICAL HISTORY:  Past Medical History:  Diagnosis Date   Anxiety    Arthritis    Dyspnea    GERD (gastroesophageal reflux disease)    High cholesterol    Hypertension    Parkinson disease    2022/2023   Polycythemia, 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   Past Surgical History:  Procedure Laterality Date   BACK SURGERY  2019   BIOPSY  06/14/2022   Procedure: BIOPSY;  Surgeon: Lanelle Bal, DO;  [facility];  Service: Endoscopy;;   ESOPHAGOGASTRODUODENOSCOPY (EGD) WITH PROPOFOL N/A 06/14/2022   Procedure: ESOPHAGOGASTRODUODENOSCOPY (EGD) WITH PROPOFOL;  Surgeon: Lanelle Bal, DO;  [facility];  Service: Endoscopy;  Laterality: N/A;  915AM, ASA 3   HERNIA REPAIR     NECK SURGERY  2022   SHOULDER SURGERY      SOCIAL HISTORY:  Social History   Socioeconomic History   Marital status: Married    Spouse name: Not on file   Number of children: Not on file   Years of education: Not on file   Highest education level: Not on file  Occupational History   Not on file  Tobacco Use   Smoking status: Never   Smokeless tobacco: Never  Substance and Sexual Activity   Alcohol use: Not Currently   Drug use: Not Currently   Sexual activity: Yes  Other Topics Concern   Not on file  Social History Narrative    Not on file   Social Determinants of Health   Financial Resource Strain: Not on file  Food Insecurity: Not on file  Transportation Needs: Not on file  Physical Activity: Not on file  Stress: Not on file  Social Connections: Not on file  Intimate Partner Violence: Not on file    FAMILY HISTORY:  No family history on file.  CURRENT MEDICATIONS:  Outpatient Encounter Medications as of 07/01/2022  Medication Sig Note   atorvastatin (LIPITOR) 40 MG tablet Take 40 mg by mouth daily.    Bismuth 262 MG CHEW Chew 2 tablets by mouth in the morning, at noon, in the evening, and at bedtime for 14 days.    carbidopa-levodopa (SINEMET IR) 25-100 MG tablet Take 1 tablet by mouth 4 (four) times daily.    citalopram (CELEXA) 40 MG tablet Take by mouth.    folic acid (FOLVITE) 1 MG tablet folic acid 1 mg tablet    levothyroxine (SYNTHROID) 25 MCG tablet Take 25 mcg by mouth daily.    Levothyroxine Sodium 25 MCG CAPS Take by mouth.    metoprolol succinate (  TOPROL-XL) 50 MG 24 hr tablet metoprolol succinate ER 50 mg tablet,extended release 24 hr    metroNIDAZOLE (FLAGYL) 500 MG tablet Take 1 tablet (500 mg total) by mouth 3 (three) times daily for 14 days.    pantoprazole (PROTONIX) 40 MG tablet Take 1 tablet (40 mg total) by mouth 2 (two) times daily.    tadalafil (CIALIS) 20 MG tablet Take 1 tablet (20 mg total) by mouth daily as needed. 06/14/2022: Not taking    tadalafil (CIALIS) 5 MG tablet Take 1 tablet (5 mg total) by mouth daily. 06/14/2022: Not taking    testosterone cypionate (DEPOTESTOSTERONE CYPIONATE) 200 MG/ML injection SMARTSIG:Milliliter(s) IM (Patient not taking: Reported on 06/11/2022)    tetracycline (SUMYCIN) 500 MG capsule Take 1 capsule (500 mg total) by mouth 4 (four) times daily for 14 days.    VITAMIN D, CHOLECALCIFEROL, PO Take by mouth.    No facility-administered encounter medications on file as of 07/01/2022.    ALLERGIES:  Allergies  Allergen Reactions   Oxycodone      LABORATORY DATA:  I have reviewed the labs as listed.  CBC    Component Value Date/Time   WBC 7.3 03/28/2022 1326   RBC 4.82 03/28/2022 1326   HGB 14.2 03/28/2022 1326   HCT 42.3 03/28/2022 1326   PLT 209 03/28/2022 1326   MCV 87.8 03/28/2022 1326   MCH 29.5 03/28/2022 1326   MCHC 33.6 03/28/2022 1326   RDW 13.2 03/28/2022 1326   LYMPHSABS 1.3 02/16/2021 1018   MONOABS 0.5 02/16/2021 1018   EOSABS 0.1 02/16/2021 1018   BASOSABS 0.0 02/16/2021 1018       No data to display          DIAGNOSTIC IMAGING:  I have independently reviewed the relevant imaging and discussed with the patient.   WRAP UP:  All questions were answered. The patient knows to call the clinic with any problems, questions or concerns.  Medical decision making: Low  Time spent on visit: I spent 15 minutes counseling the patient face to face. The total time spent in the appointment was 22 minutes and more than 50% was on counseling.  Carnella Guadalajara, PA-C  07/01/22 2:40 PM

## 2022-07-01 ENCOUNTER — Inpatient Hospital Stay: Payer: Medicare PPO

## 2022-07-01 ENCOUNTER — Inpatient Hospital Stay: Payer: Medicare PPO | Attending: Hematology | Admitting: Physician Assistant

## 2022-07-01 VITALS — BP 143/91 | HR 57 | Temp 98.8°F | Resp 16 | Wt 225.5 lb

## 2022-07-01 DIAGNOSIS — G20A1 Parkinson's disease without dyskinesia, without mention of fluctuations: Secondary | ICD-10-CM | POA: Insufficient documentation

## 2022-07-01 DIAGNOSIS — Z7982 Long term (current) use of aspirin: Secondary | ICD-10-CM | POA: Insufficient documentation

## 2022-07-01 DIAGNOSIS — D751 Secondary polycythemia: Secondary | ICD-10-CM | POA: Diagnosis not present

## 2022-07-01 DIAGNOSIS — R1310 Dysphagia, unspecified: Secondary | ICD-10-CM | POA: Diagnosis not present

## 2022-07-01 DIAGNOSIS — R42 Dizziness and giddiness: Secondary | ICD-10-CM | POA: Insufficient documentation

## 2022-07-01 DIAGNOSIS — R531 Weakness: Secondary | ICD-10-CM | POA: Diagnosis not present

## 2022-07-01 DIAGNOSIS — Z79899 Other long term (current) drug therapy: Secondary | ICD-10-CM | POA: Diagnosis not present

## 2022-07-01 DIAGNOSIS — Z79890 Hormone replacement therapy (postmenopausal): Secondary | ICD-10-CM | POA: Diagnosis not present

## 2022-07-01 LAB — CBC
HCT: 41.5 % (ref 39.0–52.0)
Hemoglobin: 13.7 g/dL (ref 13.0–17.0)
MCH: 30.9 pg (ref 26.0–34.0)
MCHC: 33 g/dL (ref 30.0–36.0)
MCV: 93.5 fL (ref 80.0–100.0)
Platelets: 310 10*3/uL (ref 150–400)
RBC: 4.44 MIL/uL (ref 4.22–5.81)
RDW: 13.3 % (ref 11.5–15.5)
WBC: 9.6 10*3/uL (ref 4.0–10.5)
nRBC: 0 % (ref 0.0–0.2)

## 2022-07-01 NOTE — Patient Instructions (Signed)
[facility] **VISIT SUMMARY & IMPORTANT INSTRUCTIONS **   You were seen today by Rojelio Brenner PA-C for your history of elevated blood counts.   Your blood counts were previously elevated while you were on testosterone injections. They have come back to normal ever since you topped testosterone injections. At this point, we will discharge your from the clinic.  You do not need another appointment with [place], unless... You restart testosterone injections. You have elevated blood counts even when you are not taking testosterone.   ** Thank you for trusting me with your healthcare!  I strive to provide all of my patients with quality care at each visit.  If you receive a survey for this visit, I would be so grateful to you for taking the time to provide feedback.  Thank you in advance!  ~ Erskine Steinfeldt                   Dr. Doreatha Massed   &   Rojelio Brenner, PA-C   - - - - - - - - - - - - - - - - - -    Thank you for choosing [facility] at [facility] to provide your oncology and hematology care.  To afford each patient quality time with our provider, please arrive at least 15 minutes before your scheduled appointment time.   If you have a lab appointment with the Cancer Center please come in thru the Main Entrance and check in at the main information desk.  You need to re-schedule your appointment should you arrive 10 or more minutes late.  We strive to give you quality time with our providers, and arriving late affects you and other patients whose appointments are after yours.  Also, if you no show three or more times for appointments you may be dismissed from the clinic at the providers discretion.     Again, thank you for choosing [facility].  Our hope is that these requests will decrease the amount of time that you wait before being seen by our physicians.        _____________________________________________________________  Should you have questions after your visit to [facility], please contact our office at [phone] and follow the prompts.  Our office hours are 8:00 a.m. and 4:30 p.m. Monday - Friday.  Please note that voicemails left after 4:00 p.m. may not be returned until the following business day.  We are closed weekends and major holidays.  You do have access to a nurse 24-7, just call the main number to the clinic [phone] and do not press any options, hold on the line and a nurse will answer the phone.    For prescription refill requests, have your pharmacy contact our office and allow 72 hours.

## 2022-07-01 NOTE — Progress Notes (Signed)
No phlebotomy today per Rojelio Brenner PA-C.  Hematocrit today is 41.5.

## 2022-07-12 ENCOUNTER — Ambulatory Visit: Payer: Medicare Other | Admitting: Urology

## 2022-07-12 DIAGNOSIS — N529 Male erectile dysfunction, unspecified: Secondary | ICD-10-CM

## 2022-07-29 DIAGNOSIS — M25512 Pain in left shoulder: Secondary | ICD-10-CM | POA: Diagnosis not present

## 2022-07-29 DIAGNOSIS — M546 Pain in thoracic spine: Secondary | ICD-10-CM | POA: Diagnosis not present

## 2022-07-29 DIAGNOSIS — M9907 Segmental and somatic dysfunction of upper extremity: Secondary | ICD-10-CM | POA: Diagnosis not present

## 2022-07-29 DIAGNOSIS — M25511 Pain in right shoulder: Secondary | ICD-10-CM | POA: Diagnosis not present

## 2022-07-29 DIAGNOSIS — M9902 Segmental and somatic dysfunction of thoracic region: Secondary | ICD-10-CM | POA: Diagnosis not present

## 2022-08-02 DIAGNOSIS — M25511 Pain in right shoulder: Secondary | ICD-10-CM | POA: Diagnosis not present

## 2022-08-02 DIAGNOSIS — M9907 Segmental and somatic dysfunction of upper extremity: Secondary | ICD-10-CM | POA: Diagnosis not present

## 2022-08-02 DIAGNOSIS — M546 Pain in thoracic spine: Secondary | ICD-10-CM | POA: Diagnosis not present

## 2022-08-02 DIAGNOSIS — M25512 Pain in left shoulder: Secondary | ICD-10-CM | POA: Diagnosis not present

## 2022-08-02 DIAGNOSIS — M9902 Segmental and somatic dysfunction of thoracic region: Secondary | ICD-10-CM | POA: Diagnosis not present

## 2022-08-05 DIAGNOSIS — M25511 Pain in right shoulder: Secondary | ICD-10-CM | POA: Diagnosis not present

## 2022-08-05 DIAGNOSIS — M9902 Segmental and somatic dysfunction of thoracic region: Secondary | ICD-10-CM | POA: Diagnosis not present

## 2022-08-05 DIAGNOSIS — M546 Pain in thoracic spine: Secondary | ICD-10-CM | POA: Diagnosis not present

## 2022-08-05 DIAGNOSIS — M25512 Pain in left shoulder: Secondary | ICD-10-CM | POA: Diagnosis not present

## 2022-08-05 DIAGNOSIS — M9907 Segmental and somatic dysfunction of upper extremity: Secondary | ICD-10-CM | POA: Diagnosis not present

## 2022-08-07 DIAGNOSIS — M9902 Segmental and somatic dysfunction of thoracic region: Secondary | ICD-10-CM | POA: Diagnosis not present

## 2022-08-07 DIAGNOSIS — M9907 Segmental and somatic dysfunction of upper extremity: Secondary | ICD-10-CM | POA: Diagnosis not present

## 2022-08-07 DIAGNOSIS — M25511 Pain in right shoulder: Secondary | ICD-10-CM | POA: Diagnosis not present

## 2022-08-07 DIAGNOSIS — M25512 Pain in left shoulder: Secondary | ICD-10-CM | POA: Diagnosis not present

## 2022-08-07 DIAGNOSIS — M546 Pain in thoracic spine: Secondary | ICD-10-CM | POA: Diagnosis not present

## 2022-08-12 DIAGNOSIS — M25512 Pain in left shoulder: Secondary | ICD-10-CM | POA: Diagnosis not present

## 2022-08-12 DIAGNOSIS — M9907 Segmental and somatic dysfunction of upper extremity: Secondary | ICD-10-CM | POA: Diagnosis not present

## 2022-08-12 DIAGNOSIS — M25511 Pain in right shoulder: Secondary | ICD-10-CM | POA: Diagnosis not present

## 2022-08-12 DIAGNOSIS — M546 Pain in thoracic spine: Secondary | ICD-10-CM | POA: Diagnosis not present

## 2022-08-12 DIAGNOSIS — M9902 Segmental and somatic dysfunction of thoracic region: Secondary | ICD-10-CM | POA: Diagnosis not present

## 2022-08-14 DIAGNOSIS — M9902 Segmental and somatic dysfunction of thoracic region: Secondary | ICD-10-CM | POA: Diagnosis not present

## 2022-08-14 DIAGNOSIS — M25512 Pain in left shoulder: Secondary | ICD-10-CM | POA: Diagnosis not present

## 2022-08-14 DIAGNOSIS — M9907 Segmental and somatic dysfunction of upper extremity: Secondary | ICD-10-CM | POA: Diagnosis not present

## 2022-08-14 DIAGNOSIS — M546 Pain in thoracic spine: Secondary | ICD-10-CM | POA: Diagnosis not present

## 2022-08-14 DIAGNOSIS — M25511 Pain in right shoulder: Secondary | ICD-10-CM | POA: Diagnosis not present

## 2022-08-21 DIAGNOSIS — M546 Pain in thoracic spine: Secondary | ICD-10-CM | POA: Diagnosis not present

## 2022-08-21 DIAGNOSIS — M9902 Segmental and somatic dysfunction of thoracic region: Secondary | ICD-10-CM | POA: Diagnosis not present

## 2022-08-21 DIAGNOSIS — M9907 Segmental and somatic dysfunction of upper extremity: Secondary | ICD-10-CM | POA: Diagnosis not present

## 2022-08-21 DIAGNOSIS — M25512 Pain in left shoulder: Secondary | ICD-10-CM | POA: Diagnosis not present

## 2022-08-21 DIAGNOSIS — M25511 Pain in right shoulder: Secondary | ICD-10-CM | POA: Diagnosis not present

## 2022-08-22 NOTE — Progress Notes (Signed)
GI Office Note    Referring Provider: Assunta Found, MD Primary Care Physician:  Alex Found, MD Primary Gastroenterologist: Alex Duos. Marletta Lor, DO  Date:  08/27/2022  ID:  Alex [name], DOB [birth year], MRN [id number]   Chief Complaint   Chief Complaint  Patient presents with   Colonoscopy    Colonoscopy screening. Change in bowel habits    History of Present Illness  Alex [name] is a 67 y.o. male with a history of Parkinson's, CAD, HTN, HLD, polycythemia, OSA, and hypothyroidism presenting today with request to schedule colonoscopy.  Colonoscopy October 2020: -3 cecal polyps -2 distal transverse and descending colon polyps -Pathology: tubular adenoma and sessile serrated adenoma -Repeat in 5 years  MBS May 2023: IMPRESSION: Pt presents with normal to mild oropharyngeal dysphagia characterized by slight difficulty with bolus cohesiveness with mixed consistencies (barium tablet with cup thin) where Pt swallowed the thin liquid (head of bolus) and the barium tablet (tail of bolus) remained in valleculae. Pt then swallowed a sip of liquid to clear effectively. Pt exhibited premature spillage with thin liquids via straw sip to the level of the pyriforms resulting in trace penetration and aspiration of thins (spills over arytenoids) just below the vocal folds, but is immediately expelled with a spontaneous cough. Pt's demonstrated normal swallow function and physiology with all other presentations (thin via tsp/cup, puree, and regular textures). Pt was sensate to the brief stasis of barium tablet in the valleculae and he indicates that the sensation is the same as he feels periodically with solids at home. Recommend regular textures and thin liquids and [name] for pills with thin and follow with liquid wash, cut pieces of meats and breads to bite size or chew thoroughly. No dysphagia therapy recommended at this time. The imaging from [facility] was reviewed with Pt and spouse. He reports  xerstomia, occasional coughing on his saliva, and exhibited throat clearing during our visit. Pt also noted to have prominent cricopharyngeus, possibly related to reflux. Pt was given reflux and esophageal precautions to follow at home and encouraged to swallow more frequently. He may benefit from PT evaluation due to history of falls with his report of decreased balance in setting of Parkinson's. Pt may also benefit from SLP evaluation for dysarthria given Pt and spousal reports of reduced vocal intensity and occasional decreased speech intelligibility in setting of Parkinson's.   Last office visit 06/11/2022.  Patient reported worsening dysphagia over the last year.  Notes his words are very slurry and at times his voice goes in and out and gets hoarse.  Denies any odynophagia.  Has difficulty with [place] foods.  And some other meats.  Mostly has difficulty with pills.  Denies any epigastric pain.  Has started on pantoprazole week prior to his office visit and had not seen much difference.  Does not feel like Sinemet is helping much with his balance and tremors.  He was scheduled for upper endoscopy with dilation.  Advised continue PPI once daily.  Dysphagia precautions discussed with him.  EGD 06/14/2022: -No endoscopic evidence of stenosis or stricture -Diffuse gastritis s/p biopsy -Normal duodenum -Pathology revealed antral and oxyntic mucosa with active, chronic gastritis with erosion, staining positive for H. Pylori -Patient sent in tetracycline, metronidazole, bismuth, and advised to increase PPI to twice daily for 14 days.  Today: Goes to see parkinson's expert in September in [place].   Still having some issues with dysphagia. Has not taken pantoprazole today. Doesn't see much difference with it.  Denies any  typical reflux symptoms.  Not currently taking anything to help with constipation. He is unsure what he has taken in the past to help.  He states this is a chronic issue.  Mostly  likely stool softener. Uses a bedat to help with bowel movements. He can feel if she is using too much pressure. He admits to not drinking a lot of water. He can drink flavored water but does not like regular. Primarily dinks tea and koolaid.   Has been having little balls of stool that are hard. He usually goes everyday. Sometimes has a decent BM and sometimes just a little. Denies any abdominal pain, melena, or brbpr. Does have to strain a lot to have a BM.     Current Outpatient Medications  Medication Sig Dispense Refill   atorvastatin (LIPITOR) 40 MG tablet Take 40 mg by mouth daily.     carbidopa-levodopa (SINEMET IR) 25-100 MG tablet Take 1 tablet by mouth 4 (four) times daily.     citalopram (CELEXA) 40 MG tablet Take by mouth.     folic acid (FOLVITE) 1 MG tablet folic acid 1 mg tablet     levothyroxine (SYNTHROID) 25 MCG tablet Take 25 mcg by mouth daily.     Levothyroxine Sodium 25 MCG CAPS Take by mouth.     meloxicam (MOBIC) 7.5 MG tablet Take 2 tablets by mouth daily.     metoprolol succinate (TOPROL-XL) 50 MG 24 hr tablet metoprolol succinate ER 50 mg tablet,extended release 24 hr     pantoprazole (PROTONIX) 40 MG tablet Take 1 tablet (40 mg total) by mouth 2 (two) times daily. 60 tablet 11   traMADol (ULTRAM) 50 MG tablet Take 50 mg by mouth every 6 (six) hours.     VITAMIN D, CHOLECALCIFEROL, PO Take by mouth.     tadalafil (CIALIS) 20 MG tablet Take 1 tablet (20 mg total) by mouth daily as needed. (Patient not taking: Reported on 08/27/2022) 10 tablet 5   tadalafil (CIALIS) 5 MG tablet Take 1 tablet (5 mg total) by mouth daily. (Patient not taking: Reported on 08/27/2022) 30 tablet 11   testosterone cypionate (DEPOTESTOSTERONE CYPIONATE) 200 MG/ML injection  (Patient not taking: Reported on 08/27/2022)     No current facility-administered medications for this visit.    Past Medical History:  Diagnosis Date   Anxiety    Arthritis    Dyspnea    GERD (gastroesophageal  reflux disease)    High cholesterol    Hypertension    Parkinson disease    2022/2023   Polycythemia, 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    Past Surgical History:  Procedure Laterality Date   BACK SURGERY  2019   BIOPSY  06/14/2022   Procedure: BIOPSY;  Surgeon: Lanelle Bal, DO;  [facility];  Service: Endoscopy;;   ESOPHAGOGASTRODUODENOSCOPY (EGD) WITH PROPOFOL N/A 06/14/2022   Procedure: ESOPHAGOGASTRODUODENOSCOPY (EGD) WITH PROPOFOL;  Surgeon: Lanelle Bal, DO;  [facility];  Service: Endoscopy;  Laterality: N/A;  915AM, ASA 3   HERNIA REPAIR     NECK SURGERY  2022   SHOULDER SURGERY      No family history on file.  Allergies as of 08/27/2022 - Review Complete 08/27/2022  Allergen Reaction Noted   Oxycodone  06/23/2020    Social History   Socioeconomic History   Marital status: Married    Spouse name: Not on file   Number of children: Not on file   Years of education: Not on file   Highest  education level: Not on file  Occupational History   Not on file  Tobacco Use   Smoking status: Never   Smokeless tobacco: Never  Substance and Sexual Activity   Alcohol use: Not Currently   Drug use: Not Currently   Sexual activity: Yes  Other Topics Concern   Not on file  Social History Narrative   Not on file   Social Determinants of Health   Financial Resource Strain: Not on file  Food Insecurity: Not on file  Transportation Needs: Not on file  Physical Activity: Not on file  Stress: Not on file  Social Connections: Unknown (05/25/2021)   Received from [facility]   Social Network    Social Network: Not on file     Review of Systems   Gen: Denies fever, chills, anorexia. Denies fatigue, weakness, weight loss.  CV: Denies chest pain, palpitations, syncope, peripheral edema, and claudication. Resp: Denies dyspnea at rest, cough, wheezing, coughing up blood, and pleurisy. GI: See HPI Derm: Denies rash, itching, dry skin Psych: Denies  depression, anxiety, memory loss, confusion. No homicidal or suicidal ideation.  Heme: Denies bruising, bleeding, and enlarged lymph nodes.  +unsteady gait, + falls.   Physical Exam   BP 128/84 (BP Location: Left Arm, Patient Position: Sitting, Cuff Size: Large)   Pulse 64   Temp 97.7 F (36.5 C) (Temporal)   Ht 5\' 10"  (1.778 m)   Wt 231 lb 6.4 oz (105 kg)   SpO2 98%   BMI 33.20 kg/m   General:   Alert and oriented. No distress noted. Pleasant and cooperative.  Head:  Normocephalic and atraumatic. Eyes:  Conjuctiva clear without scleral icterus. Mouth:  Oral mucosa pink and moist. Good dentition. No lesions. Lungs:  Clear to auscultation bilaterally. No wheezes, rales, or rhonchi. No distress.  Heart:  S1, S2 present without murmurs appreciated.  Abdomen:  +BS, soft, non-tender and non-distended. No rebound or guarding. No HSM or masses noted. Rectal: deferred Msk:  Symmetrical without gross deformities. Normal posture.  Extremities:  Without edema. Neurologic:  Alert and  oriented x4. Mild tremor to bilateral hands.  Psych:  Alert and cooperative. Normal mood and affect.   Assessment  Byrne Nankervis is a 67 y.o. male with a history of Parkinson's, anxiety, HTN, HLD, OSA, polycythemia, and hypothyroidism presenting today with request to schedule colonoscopy given change in bowel habits.  Dysphagia: Continues to have some intermittent issues with dysphagia and occasional dysarthria.  Recent EGD without any evidence of stenosis or stricture.  Continue to suspect that his mild dysphagia symptoms are secondary to his Parkinson's.  Prior to pursuing any additional workup or seeing speech therapy he was referred to wait until his appointment to see neurology/Parkinson's specialist in [facility]  H. Pylori gastritis: [name] during recent EGD with gastritis and positive biopsies. Treated with bismuth quadruple therapy with tetracycline and metronidazole.  He was already on PPI once daily  and this was increased to twice daily for 2 weeks.  He is currently back to taking it once daily.  He denies any nausea, vomiting, or abdominal pain.  Continues to have dysphagia as stated above.  Has not taken pantoprazole today therefore I recommended for him to continue to hold it for 2 weeks and then perform H. pylori breath test to assess for eradication.  Constipation: Reports chronic issues with constipation.  Has [place] 1/hard balls of stool on a daily basis.  Also admits to frequent straining.  He does admit to not drinking  lots of water, diet consist of mostly tea and Kool-Aid.  Denies any rectal bleeding or abdominal pain.  I also suspect that his constipation probably recently has worsened by his Parkinson's.  Previous labs in 2020 with normal TSH.  He states that he has been dealing with constipation for many years although just recently worsening.  He states he has tried other things in the past but unsure what, his wife states possibly a stool softener.  Advised we will start with over-the-counter regimen including high-fiber diet and daily MiraLAX.  I reinforced the importance of increased water intake as well as consistency with MiraLAX.  PLAN   Stop pantoprazole for 2 weeks then perform H. pylori breath test. Miralax 17 g daily.  Increase to twice a day if needed. High-fiber diet Increase intake of water daily. Continue to follow-up with Parkinson's specialist. Regular exercise as able. Follow up in 2 months.   Brooke Bonito, MSN, FNP-BC, AGACNP-BC [facility]

## 2022-08-23 DIAGNOSIS — M546 Pain in thoracic spine: Secondary | ICD-10-CM | POA: Diagnosis not present

## 2022-08-23 DIAGNOSIS — M9907 Segmental and somatic dysfunction of upper extremity: Secondary | ICD-10-CM | POA: Diagnosis not present

## 2022-08-23 DIAGNOSIS — M9902 Segmental and somatic dysfunction of thoracic region: Secondary | ICD-10-CM | POA: Diagnosis not present

## 2022-08-23 DIAGNOSIS — M25511 Pain in right shoulder: Secondary | ICD-10-CM | POA: Diagnosis not present

## 2022-08-23 DIAGNOSIS — M25512 Pain in left shoulder: Secondary | ICD-10-CM | POA: Diagnosis not present

## 2022-08-26 ENCOUNTER — Ambulatory Visit: Payer: Medicare PPO | Admitting: Gastroenterology

## 2022-08-27 ENCOUNTER — Ambulatory Visit: Payer: Medicare PPO | Admitting: Gastroenterology

## 2022-08-27 ENCOUNTER — Encounter: Payer: Self-pay | Admitting: Gastroenterology

## 2022-08-27 VITALS — BP 128/84 | HR 64 | Temp 97.7°F | Ht 70.0 in | Wt 231.4 lb

## 2022-08-27 DIAGNOSIS — K5900 Constipation, unspecified: Secondary | ICD-10-CM

## 2022-08-27 DIAGNOSIS — B9681 Helicobacter pylori [H. pylori] as the cause of diseases classified elsewhere: Secondary | ICD-10-CM

## 2022-08-27 DIAGNOSIS — K2970 Gastritis, unspecified, without bleeding: Secondary | ICD-10-CM | POA: Diagnosis not present

## 2022-08-27 DIAGNOSIS — R1319 Other dysphagia: Secondary | ICD-10-CM | POA: Diagnosis not present

## 2022-08-27 NOTE — Patient Instructions (Addendum)
Stop taking pantoprazole.  In 2 weeks (after 8/27) I want you to go to the lab to have H. pylori breath test performed.  This to ensure that the antibiotics you took after your upper endoscopy have eradicated her infection.  For your constipation I want you to increase fiber in your diet, I have attached a high-fiber handout for you today.  I also want you to start taking MiraLAX 17 g (1 capful) daily.  You should mix this in 8 ounces of fluid of your choice.  Is important that you are consistent with this every single day.  Given your Parkinson's is also important for you to stay as active as possible and whenever it is safe as ongoing activity is important for constipation management.  If the MiraLAX seems helpful but still not going more regularly, you may increase to twice a day.  I will see you for follow-up in 2 months.   It was a pleasure to see you today. I want to create trusting relationships with patients. If you receive a survey regarding your visit,  I greatly appreciate you taking time to fill this out on paper or through your MyChart. I value your feedback.  Brooke Bonito, MSN, FNP-BC, AGACNP-BC [facility]

## 2022-08-28 DIAGNOSIS — M25512 Pain in left shoulder: Secondary | ICD-10-CM | POA: Diagnosis not present

## 2022-08-28 DIAGNOSIS — M9907 Segmental and somatic dysfunction of upper extremity: Secondary | ICD-10-CM | POA: Diagnosis not present

## 2022-08-28 DIAGNOSIS — M546 Pain in thoracic spine: Secondary | ICD-10-CM | POA: Diagnosis not present

## 2022-08-28 DIAGNOSIS — M25511 Pain in right shoulder: Secondary | ICD-10-CM | POA: Diagnosis not present

## 2022-08-28 DIAGNOSIS — M9902 Segmental and somatic dysfunction of thoracic region: Secondary | ICD-10-CM | POA: Diagnosis not present

## 2022-08-30 DIAGNOSIS — I10 Essential (primary) hypertension: Secondary | ICD-10-CM | POA: Diagnosis not present

## 2022-08-30 DIAGNOSIS — E782 Mixed hyperlipidemia: Secondary | ICD-10-CM | POA: Diagnosis not present

## 2022-08-30 DIAGNOSIS — Z1331 Encounter for screening for depression: Secondary | ICD-10-CM | POA: Diagnosis not present

## 2022-08-30 DIAGNOSIS — E559 Vitamin D deficiency, unspecified: Secondary | ICD-10-CM | POA: Diagnosis not present

## 2022-08-30 DIAGNOSIS — E6609 Other obesity due to excess calories: Secondary | ICD-10-CM | POA: Diagnosis not present

## 2022-08-30 DIAGNOSIS — Z6836 Body mass index (BMI) 36.0-36.9, adult: Secondary | ICD-10-CM | POA: Diagnosis not present

## 2022-08-30 DIAGNOSIS — E039 Hypothyroidism, unspecified: Secondary | ICD-10-CM | POA: Diagnosis not present

## 2022-08-30 DIAGNOSIS — G20A1 Parkinson's disease without dyskinesia, without mention of fluctuations: Secondary | ICD-10-CM | POA: Diagnosis not present

## 2022-08-30 DIAGNOSIS — Z0001 Encounter for general adult medical examination with abnormal findings: Secondary | ICD-10-CM | POA: Diagnosis not present

## 2022-09-10 DIAGNOSIS — B9681 Helicobacter pylori [H. pylori] as the cause of diseases classified elsewhere: Secondary | ICD-10-CM | POA: Diagnosis not present

## 2022-09-10 DIAGNOSIS — K2970 Gastritis, unspecified, without bleeding: Secondary | ICD-10-CM | POA: Diagnosis not present

## 2022-09-17 DIAGNOSIS — G20A1 Parkinson's disease without dyskinesia, without mention of fluctuations: Secondary | ICD-10-CM | POA: Diagnosis not present

## 2022-09-23 ENCOUNTER — Encounter: Payer: Self-pay | Admitting: Gastroenterology

## 2022-10-21 DIAGNOSIS — H8110 Benign paroxysmal vertigo, unspecified ear: Secondary | ICD-10-CM | POA: Diagnosis not present

## 2022-10-21 DIAGNOSIS — E6609 Other obesity due to excess calories: Secondary | ICD-10-CM | POA: Diagnosis not present

## 2022-10-21 DIAGNOSIS — Z6836 Body mass index (BMI) 36.0-36.9, adult: Secondary | ICD-10-CM | POA: Diagnosis not present

## 2022-10-21 DIAGNOSIS — Z23 Encounter for immunization: Secondary | ICD-10-CM | POA: Diagnosis not present

## 2023-01-16 DIAGNOSIS — H524 Presbyopia: Secondary | ICD-10-CM | POA: Diagnosis not present

## 2023-01-16 DIAGNOSIS — H25813 Combined forms of age-related cataract, bilateral: Secondary | ICD-10-CM | POA: Diagnosis not present

## 2023-01-16 DIAGNOSIS — H35033 Hypertensive retinopathy, bilateral: Secondary | ICD-10-CM | POA: Diagnosis not present

## 2023-02-06 DIAGNOSIS — E039 Hypothyroidism, unspecified: Secondary | ICD-10-CM | POA: Diagnosis not present

## 2023-02-06 DIAGNOSIS — Z6836 Body mass index (BMI) 36.0-36.9, adult: Secondary | ICD-10-CM | POA: Diagnosis not present

## 2023-02-06 DIAGNOSIS — E782 Mixed hyperlipidemia: Secondary | ICD-10-CM | POA: Diagnosis not present

## 2023-02-06 DIAGNOSIS — E291 Testicular hypofunction: Secondary | ICD-10-CM | POA: Diagnosis not present

## 2023-02-06 DIAGNOSIS — E559 Vitamin D deficiency, unspecified: Secondary | ICD-10-CM | POA: Diagnosis not present

## 2023-02-06 DIAGNOSIS — E6609 Other obesity due to excess calories: Secondary | ICD-10-CM | POA: Diagnosis not present

## 2023-02-25 DIAGNOSIS — M25511 Pain in right shoulder: Secondary | ICD-10-CM | POA: Diagnosis not present

## 2023-02-25 DIAGNOSIS — M9902 Segmental and somatic dysfunction of thoracic region: Secondary | ICD-10-CM | POA: Diagnosis not present

## 2023-02-25 DIAGNOSIS — M25512 Pain in left shoulder: Secondary | ICD-10-CM | POA: Diagnosis not present

## 2023-02-25 DIAGNOSIS — M9907 Segmental and somatic dysfunction of upper extremity: Secondary | ICD-10-CM | POA: Diagnosis not present

## 2023-02-25 DIAGNOSIS — M546 Pain in thoracic spine: Secondary | ICD-10-CM | POA: Diagnosis not present

## 2023-03-12 DIAGNOSIS — M25512 Pain in left shoulder: Secondary | ICD-10-CM | POA: Diagnosis not present

## 2023-03-12 DIAGNOSIS — M9902 Segmental and somatic dysfunction of thoracic region: Secondary | ICD-10-CM | POA: Diagnosis not present

## 2023-03-12 DIAGNOSIS — M25511 Pain in right shoulder: Secondary | ICD-10-CM | POA: Diagnosis not present

## 2023-03-12 DIAGNOSIS — M546 Pain in thoracic spine: Secondary | ICD-10-CM | POA: Diagnosis not present

## 2023-03-12 DIAGNOSIS — M9907 Segmental and somatic dysfunction of upper extremity: Secondary | ICD-10-CM | POA: Diagnosis not present

## 2023-03-26 DIAGNOSIS — M9905 Segmental and somatic dysfunction of pelvic region: Secondary | ICD-10-CM | POA: Diagnosis not present

## 2023-03-26 DIAGNOSIS — M9902 Segmental and somatic dysfunction of thoracic region: Secondary | ICD-10-CM | POA: Diagnosis not present

## 2023-03-26 DIAGNOSIS — M62830 Muscle spasm of back: Secondary | ICD-10-CM | POA: Diagnosis not present

## 2023-03-26 DIAGNOSIS — M9903 Segmental and somatic dysfunction of lumbar region: Secondary | ICD-10-CM | POA: Diagnosis not present

## 2023-04-01 DIAGNOSIS — W19XXXA Unspecified fall, initial encounter: Secondary | ICD-10-CM | POA: Diagnosis not present

## 2023-04-01 DIAGNOSIS — R55 Syncope and collapse: Secondary | ICD-10-CM | POA: Diagnosis not present

## 2023-04-01 DIAGNOSIS — E860 Dehydration: Secondary | ICD-10-CM | POA: Diagnosis not present

## 2023-04-01 DIAGNOSIS — R42 Dizziness and giddiness: Secondary | ICD-10-CM | POA: Diagnosis not present

## 2023-04-15 DIAGNOSIS — H8110 Benign paroxysmal vertigo, unspecified ear: Secondary | ICD-10-CM | POA: Diagnosis not present

## 2023-04-15 DIAGNOSIS — Z6834 Body mass index (BMI) 34.0-34.9, adult: Secondary | ICD-10-CM | POA: Diagnosis not present

## 2023-04-15 DIAGNOSIS — E6609 Other obesity due to excess calories: Secondary | ICD-10-CM | POA: Diagnosis not present

## 2023-04-15 DIAGNOSIS — I951 Orthostatic hypotension: Secondary | ICD-10-CM | POA: Diagnosis not present

## 2023-04-21 DIAGNOSIS — M5451 Vertebrogenic low back pain: Secondary | ICD-10-CM | POA: Diagnosis not present

## 2023-04-21 DIAGNOSIS — M545 Low back pain, unspecified: Secondary | ICD-10-CM | POA: Diagnosis not present

## 2023-04-23 ENCOUNTER — Encounter (HOSPITAL_COMMUNITY): Payer: Self-pay | Admitting: Hematology

## 2023-04-23 ENCOUNTER — Encounter: Payer: Self-pay | Admitting: Neurology

## 2023-04-29 ENCOUNTER — Encounter (HOSPITAL_COMMUNITY): Payer: Self-pay | Admitting: Hematology

## 2023-05-05 DIAGNOSIS — M545 Low back pain, unspecified: Secondary | ICD-10-CM | POA: Diagnosis not present

## 2023-05-05 NOTE — Progress Notes (Signed)
 Assessment/Plan:     idiopathic Parkinson's disease.  The patient has tremor, bradykinesia, rigidity and mild postural instability.  -We discussed the diagnosis as well as pathophysiology of the disease.  We discussed treatment options as well as prognostic indicators.  Patient education was provided.  -We discussed that it used to be thought that levodopa would increase risk of melanoma but now it is believed that Parkinsons itself likely increases risk of melanoma. he is to get r egular skin checks.  Information to dermatologist was given.  -Greater than 50% of the 60 minute visit was spent in counseling answering questions and talking about what to expect now as well as in the future.  We talked about medication options as well as potential future surgical options.  We talked about safety in the home.  -We talked about the levodopa and that all patients with Parkinsons Disease will need levodopa.  He is quite rigid.  After discussion, we decided to hold on it just for a few weeks until we get the BP/Neurogenic Orthostatic Hypotension under better control.  We will bring him back in a few weeks.  At that point in time, we will need to discuss if we will restart immediate release levodopa (had some nausea with it) or if we will start another variety of levodopa.  He did have hallucinations on high dose levodopa (3 tablets 3 times per day)  -I will refer the patient for PT once we get him back on levodopa next visit in a few weeks  -We discussed community resources in the area including patient support groups and community exercise programs for PD and pt education was provided to the patient.   - He and I discussed extensively regular daily schedule.  He is sleeping too late in the day and taking too long of naps.  2.  RBD  - He does not fall out of the bed.  He does have yelling out.  Bedroom safety discussed.  3.  Dysphagia  - Last modified barium swallow was in May, 2023 with evidence of  mild oropharyngeal dysphagia.  -Would recommend repeating modified barium swallow.  He was agreeable.  - Patient follows with [facility] for this symptom in May, 2024.  He was scoped and treated for H. pylori.  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   -Nuedexta may be helpful in the future  5.  Neurogenic Orthostatic Hypotension  -looks like he was on midodrine  years ago from [facility] but he doesn't recall that  -he had syncopal episode a few weeks ago (sounded like vasovagal - just getting off of the toilet)  -he is now off of the metoprolol  -we will start low dose midodrine  2.5 mg three times per day with meals.  6.  Sialorrhea  - Patient states that this is actually better right now.  We can consider Botox in the future, if needed.  Subjective:   Alex [name] was seen today in the movement disorders clinic for neurologic consultation at the request of Minus Amel, MD.  The consultation is for the evaluation of Parkinsons disease.  This patient is accompanied in the office by his spouse who supplements the history. Patient has previously been to Dr. Joleen Navy (no notes available) as well as Dr. Jeneane Miracle.  He only saw Dr. Jeneane Miracle 1 time in September, 2024 and I did have the opportunity to review her notes.  When he saw Dr. Jeneane Miracle, she increased his levodopa from 2 tablets 3 times  per day to 3 tablets 3 times per day.  She has ordered physical and speech therapy.  He reports that he thought that levodopa 3 po tid caused hallucinations.  He was also on tramadol at the time, but states that he stopped the tramadol and still have some hallucinations.  He backed the levodopa back down to 2 tablets twice per day.  He admits to some nausea with the levodopa as well.  He has been off of the carbidopa/levodopa for 1 week and he doesn't feel well.  "I'm slow and sluggish."  Patient's first symptoms were in approximately 2019.  For symptoms consisted of left hand tremor and gait instability.   Patient is right-hand dominant.  Current prescribed movement disorder medications: Carbidopa/levodopa 25/100, 3 tablets 3 times per day at 9 AM/2 PM/6 PM (these were the prescribed times by Dr. Jeneane Miracle, but patient states that he was generally taking morning, midday and bedtime)  Specific Symptoms:  Tremor: Yes.  , started in the L hand but now the R hand as well Family hx of similar:  "my grandpa had the shakes but they said he had arthritis" Voice: its getting weak Sleep:   Vivid Dreams:  Yes.    Acting out dreams:  Yes.   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   But that is better Postural symptoms:  Yes.    Falls?  Yes.  , last fall 3 months ago in the yard Bradykinesia symptoms: shuffling gait, slow movements, difficulty getting out of a chair, and difficulty regaining balance; has freezing spells; describes festination Loss of smell:  No. Loss of taste:  No. Urinary Incontinence:  No. Per pt but wife states that he has an issue with it Difficulty Swallowing:  Yes.  ,  Follows with [facility] for this.  Has trouble with dry foods and meats as well as pills.  Last modified barium was in May, 2023.  This demonstrated mild oropharyngeal dysphagia.  He was sent to [facility] for this symptom in May, 2024 and had a scope and was diagnosed with H. pylori.  He was treated. Trouble with ADL's:  Yes.    Trouble buttoning clothing: Yes.   Mood:  Yes.   on Celexa for panic attacks. But he states that he "sits around and crys a lot."  He states that he is not depressed when he cries Memory changes:  No. Hallucinations:  that has resolved - had it in the past N/V:  No. Lightheaded:  Yes.    Syncope: Yes.   - did 3 weeks ago getting off of the toilet and turned around and passed out momentarily Diplopia:  No. Dyskinesia:  No.    PREVIOUS MEDICATIONS: amantadine ; carbidopa/levodopa; selegeline; midodrine  (2022)  ALLERGIES:   Allergies  Allergen Reactions    Oxycodone     CURRENT MEDICATIONS:  Current Meds  Medication Sig   atorvastatin (LIPITOR) 40 MG tablet Take 40 mg by mouth daily.   carbidopa-levodopa (SINEMET IR) 25-100 MG tablet Take 1 tablet by mouth 4 (four) times daily.   citalopram (CELEXA) 40 MG tablet Take by mouth.   folic acid  (FOLVITE ) 1 MG tablet folic acid  1 mg tablet   levothyroxine (SYNTHROID) 25 MCG tablet Take 25 mcg by mouth daily.   midodrine  (PROAMATINE ) 2.5 MG tablet Take 1 tablet (2.5 mg total) by mouth 3 (three) times daily with meals.   pantoprazole  (PROTONIX ) 40 MG tablet Take 1 tablet (40 mg total) by mouth  2 (two) times daily.   VITAMIN D, CHOLECALCIFEROL, PO Take by mouth.     Objective:   VITALS:   Vitals:   05/06/23 0920 05/06/23 1039 05/06/23 1040 05/06/23 1041  BP: 110/70  120/74 100/60  Pulse: 80  (!) 54 (!) 55  SpO2: 98% 98% 98% 98%    GEN:  The patient appears stated age and is in NAD. HEENT:  Normocephalic, atraumatic.  The mucous membranes are moist. The superficial temporal arteries are without ropiness or tenderness. CV:  RRR Lungs:  CTAB Neck/HEME:  There are no carotid bruits bilaterally.  Neurological examination:  Orientation: The patient is alert and oriented x3.  Cranial nerves: There is good facial symmetry. Extraocular muscles are intact. The visual fields are full to confrontational testing. The speech is fluent and clear. Soft palate rises symmetrically and there is no tongue deviation. Hearing is intact to conversational tone. Sensation: Sensation is intact to light and pinprick throughout (facial, trunk, extremities). Vibration is intact at the bilateral big toe. There is no extinction with double simultaneous stimulation. There is no sensory dermatomal level identified. Motor: Strength is 5/5 in the bilateral upper and lower extremities, with the exception of the fact that he has difficulty abducting the shoulders bilaterally due to rotator cuff issues.  I have trouble testing  pronator drift because of shoulder issues as well. Deep tendon reflexes: Deep tendon reflexes are 2+-3/4 at the bilateral biceps, triceps, brachioradialis, patella and achilles. Plantar responses are downgoing bilaterally.  Movement examination: Tone: There is mod increased tone, L>R, UE>LE Abnormal movements: there is mild LUE rest tremor and mild RUE rest tremor that increases with distraction Coordination:  There is decremation with RAM's, with any form of RAMS, including alternating supination and pronation of the forearm, hand opening and closing, finger taps, heel taps and toe taps, UE>>LE Gait and Station: The patient pushes off to arise.  Stride length was actually fairly good.  He was mildly forward flexed.  He has slight decreased arm swing on the left.   Total time spent on today's visit was 80 minutes, including both face-to-face time and nonface-to-face time.  Time included that spent on review of records (prior notes available to me/labs/imaging if pertinent), discussing treatment and goals, answering patient's questions and coordinating care.  Cc:  Minus Amel, MD

## 2023-05-06 ENCOUNTER — Encounter: Payer: Self-pay | Admitting: Neurology

## 2023-05-06 ENCOUNTER — Ambulatory Visit: Admitting: Neurology

## 2023-05-06 VITALS — BP 100/60 | HR 55

## 2023-05-06 DIAGNOSIS — R1319 Other dysphagia: Secondary | ICD-10-CM | POA: Diagnosis not present

## 2023-05-06 DIAGNOSIS — G20A1 Parkinson's disease without dyskinesia, without mention of fluctuations: Secondary | ICD-10-CM

## 2023-05-06 DIAGNOSIS — K117 Disturbances of salivary secretion: Secondary | ICD-10-CM

## 2023-05-06 DIAGNOSIS — G4752 REM sleep behavior disorder: Secondary | ICD-10-CM | POA: Diagnosis not present

## 2023-05-06 DIAGNOSIS — G903 Multi-system degeneration of the autonomic nervous system: Secondary | ICD-10-CM

## 2023-05-06 MED ORDER — MIDODRINE HCL 2.5 MG PO TABS: 2.5000 mg | ORAL_TABLET | Freq: Three times a day (TID) | ORAL | 1 refills | Status: DC

## 2023-05-06 NOTE — Patient Instructions (Addendum)
 Good to see you, soul man!   Hold taking carbidopa/levodopa for now  We will schedule a modified barium swallow in [place]  As we discussed, it used to be thought that levodopa would increase risk of melanoma but now it is believed that Parkinsons itself likely increases risk of melanoma. I recommend yearly skin checks with a board certified dermatologist.    [facility] Locations: [facility] [address] [place], [place] [address] [phone]  [facility] Address: [address] [place], [place], [place] [address] Phone: [phone]  [facility] [address] [place], [place], [place] Phone: [phone]  [facility] [address], [place], [place] [address] Phone: [phone]  [place]: [facility] [address] [place], [place] [address] Phone: [phone]  [facility] [address] [place], [place], [place] [address] Phone: [phone]  [place]: [facility] [address], [place], [place] [address] Phone: [phone]

## 2023-05-09 DIAGNOSIS — M545 Low back pain, unspecified: Secondary | ICD-10-CM | POA: Diagnosis not present

## 2023-05-13 DIAGNOSIS — M545 Low back pain, unspecified: Secondary | ICD-10-CM | POA: Diagnosis not present

## 2023-05-14 ENCOUNTER — Telehealth (HOSPITAL_COMMUNITY): Payer: Self-pay | Admitting: Neurology

## 2023-05-14 ENCOUNTER — Other Ambulatory Visit (HOSPITAL_COMMUNITY): Payer: Self-pay | Admitting: Occupational Therapy

## 2023-05-14 NOTE — Telephone Encounter (Signed)
 Spoke with pt's wife regarding OP MBSS scheduling and they requested it be done at [facility]. Sent request to Lafonda Piety and Novella Begin at [facility] to coordinate with pt's wife, Lovina Ruddle. [facility])

## 2023-05-15 ENCOUNTER — Other Ambulatory Visit (HOSPITAL_COMMUNITY): Payer: Self-pay | Admitting: Occupational Therapy

## 2023-05-15 DIAGNOSIS — R1319 Other dysphagia: Secondary | ICD-10-CM

## 2023-05-15 DIAGNOSIS — K117 Disturbances of salivary secretion: Secondary | ICD-10-CM

## 2023-05-19 DIAGNOSIS — M545 Low back pain, unspecified: Secondary | ICD-10-CM | POA: Diagnosis not present

## 2023-05-20 ENCOUNTER — Ambulatory Visit (HOSPITAL_COMMUNITY): Admitting: Speech Pathology

## 2023-05-21 IMAGING — MR MR HEAD W/O CM
11 of 12 series · 42 of 48 positions shown · non-contrast
Comparison: Cervical spine MRI 01/06/2020.

CLINICAL DATA: Multi-system degeneration of autonomic nervous
system. Repeated falls. Additional history provided by scanning
technologist: Weakness, history of Parkinson's disease.

EXAM:
MRI HEAD WITHOUT CONTRAST
TECHNIQUE: Multiplanar, multiecho pulse sequences of the brain and surrounding
structures were obtained without intravenous contrast.

[Series 5: DWI · axial · 3.0mm · 0.80mm/px · z∈[-98,+54]mm · 5 of 52 slices shown (1 of 6)]
[im 1/52]
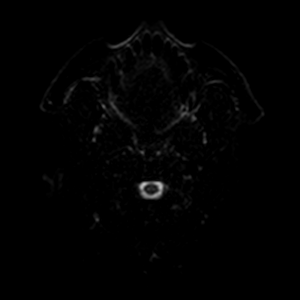
[im 13/52]
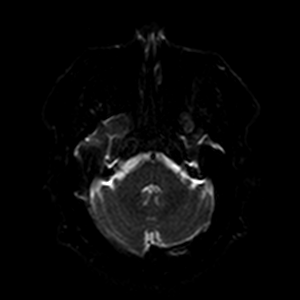
[im 26/52]
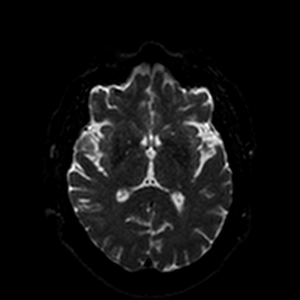
[im 39/52]
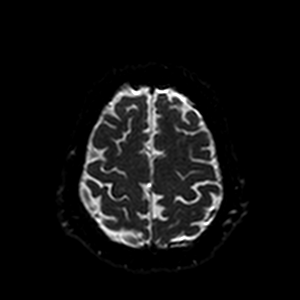
[im 52/52]
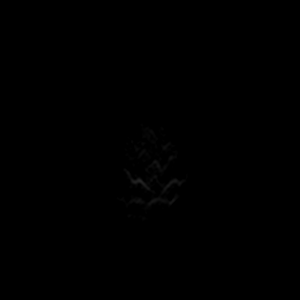

[Series 5: DWI · axial · 3.0mm · 0.80mm/px · z∈[-98,+54]mm · 6 of 52 slices shown (2 of 6)]
[im 1/52]
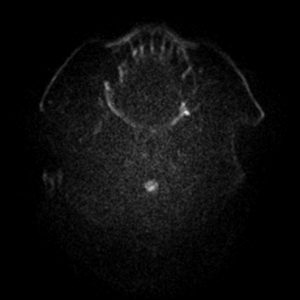
[im 11/52]
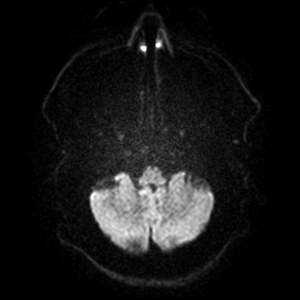
[im 21/52]
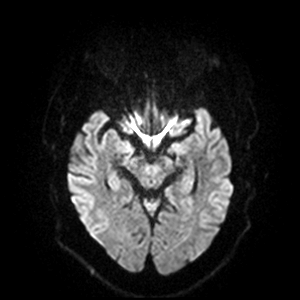
[im 31/52]
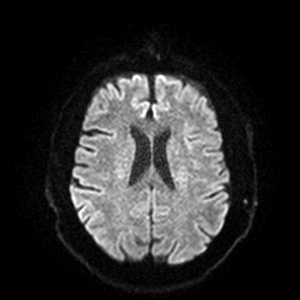
[im 41/52]
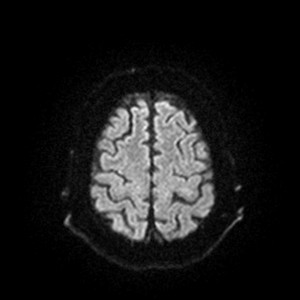
[im 52/52]
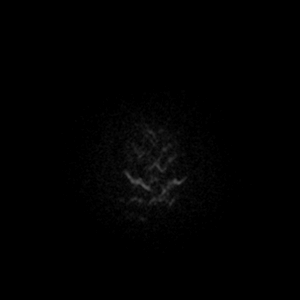

[Series 6: DWI · axial · 3.0mm · 0.80mm/px · z∈[-98,+54]mm · 6 of 52 slices shown (3 of 6)]
[im 1/52]
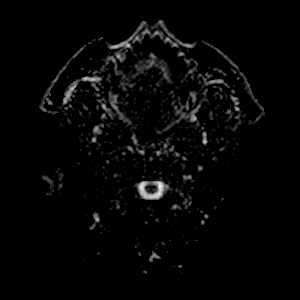
[im 11/52]
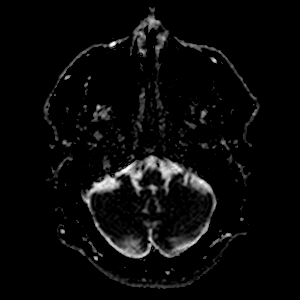
[im 21/52]
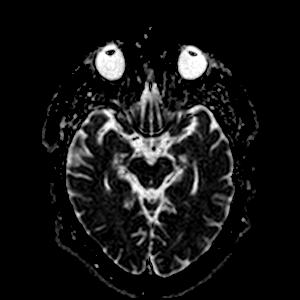
[im 31/52]
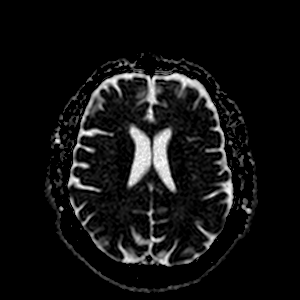
[im 41/52]
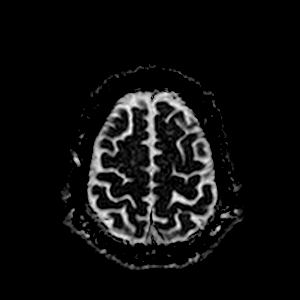
[im 52/52]
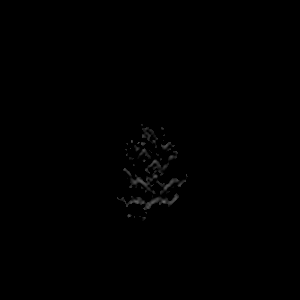

[Series 7: DWI · coronal · 5.0mm · 0.88mm/px · 3 of 30 slices shown (4 of 6)]
[im 1/30]
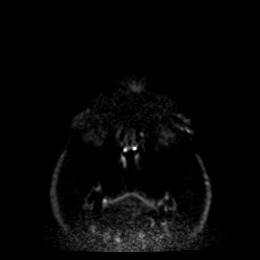
[im 15/30]
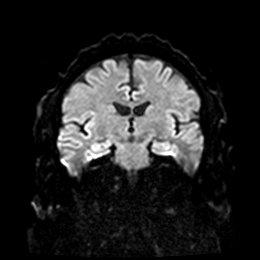
[im 30/30]
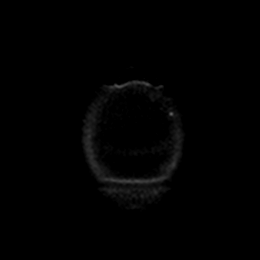

[Series 7: DWI · coronal · 5.0mm · 0.88mm/px · 3 of 30 slices shown (5 of 6)]
[im 1/30]
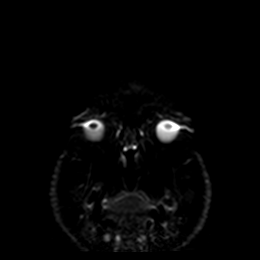
[im 15/30]
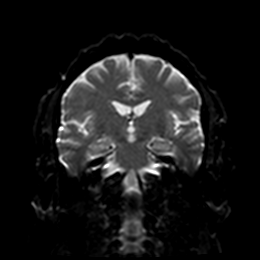
[im 30/30]
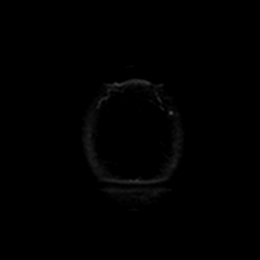

[Series 8: DWI · coronal · 5.0mm · 0.88mm/px · 3 of 30 slices shown (6 of 6)]
[im 1/30]
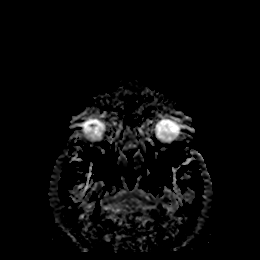
[im 15/30]
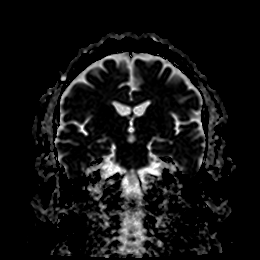
[im 30/30]
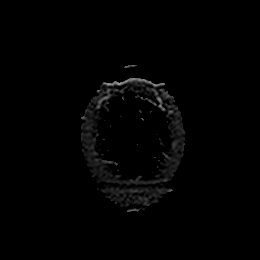

[Series 9: T1 · sagittal · 5.0mm · 0.80mm/px · 3 of 23 slices shown]
[im 1/23]
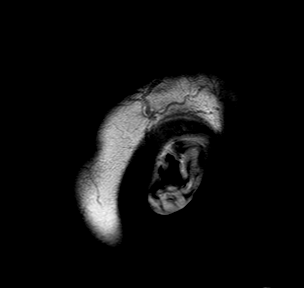
[im 12/23]
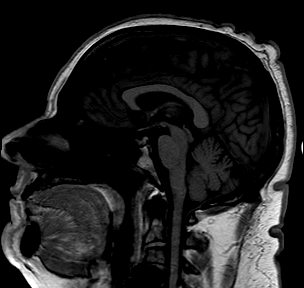
[im 23/23]
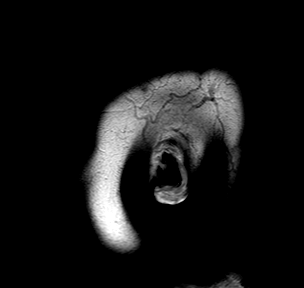

[Series 10: T2 · axial · 5.0mm · 0.72mm/px · z∈[-115,+52]mm · 3 of 25 slices shown (1 of 2)]
[im 1/25]
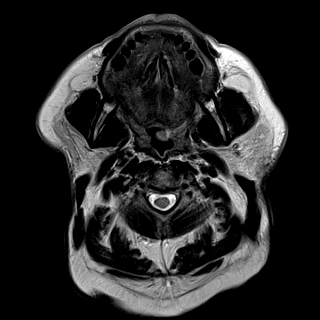
[im 13/25]
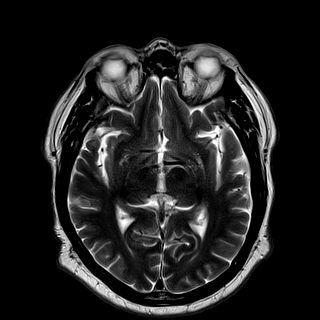
[im 25/25]
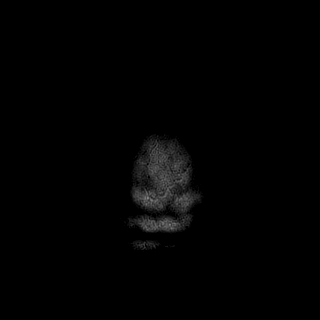

[Series 11: ax hemo · axial · 5.0mm · 0.86mm/px · z∈[-105,+50]mm · 3 of 27 slices shown]
[im 1/27]
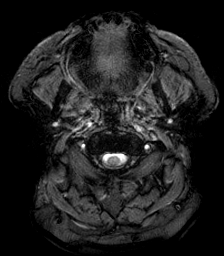
[im 14/27]
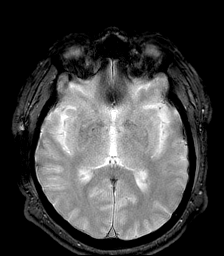
[im 27/27]
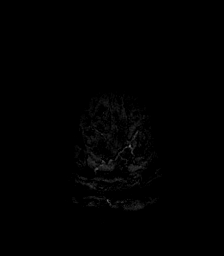

[Series 12: FLAIR · axial · 4.0mm · 0.45mm/px · z∈[-95,+56]mm · 4 of 39 slices shown]
[im 1/39]
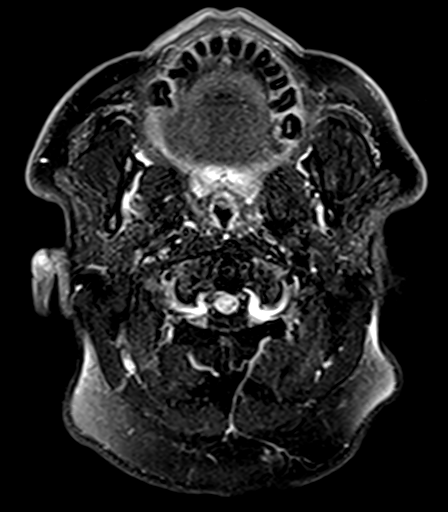
[im 13/39]
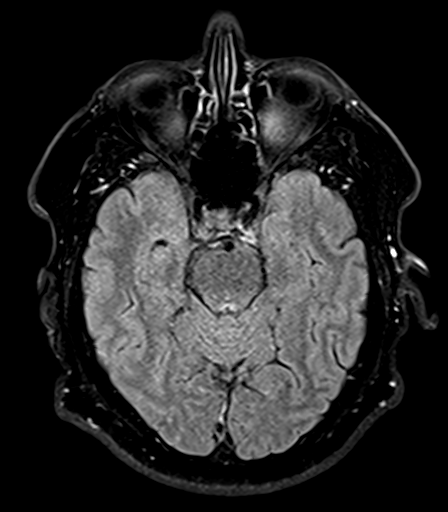
[im 26/39]
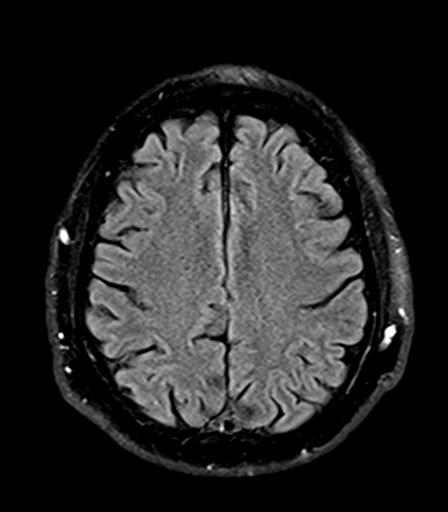
[im 39/39]
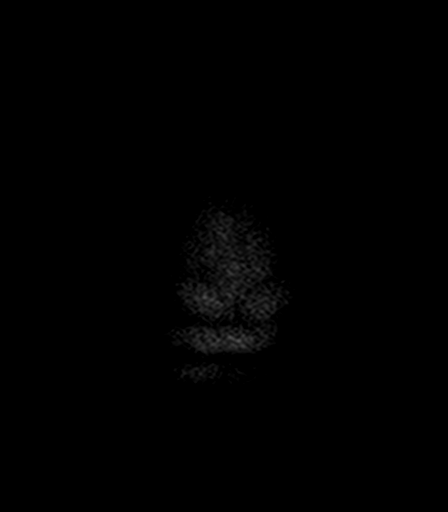

[Series 14: T2 · coronal · 5.0mm · 0.72mm/px · 3 of 28 slices shown (2 of 2)]
[im 1/28]
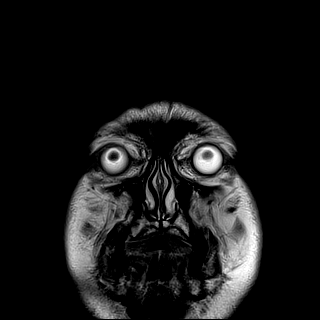
[im 14/28]
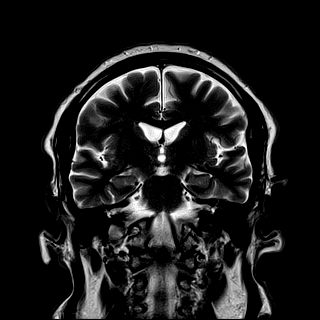
[im 28/28]
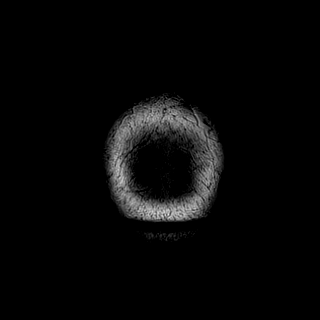

[42 of 48 positions shown; findings below may reference images not displayed]

FINDINGS: Brain:

Mild generalized cerebral and cerebellar volume loss.

No there are a few small scattered foci of T2 FLAIR hyperintense
signal abnormality within the cerebral white matter, nonspecific but
compatible with minimal changes of chronic small vessel ischemia.

There is no acute infarct.

No evidence of an intracranial mass.

No extra-axial fluid collection.

No midline shift.

Vascular: Maintained flow voids within the proximal large arterial
vessels.

Skull and upper cervical spine: No focal suspicious marrow lesion.
Susceptibility artifact arising from cervical spinal fusion
hardware.

Sinuses/Orbits: Visualized orbits show no acute finding. Trace
mucosal thickening within the bilateral ethmoid, left sphenoid and
left maxillary sinuses. Tiny mucous retention cyst within the right
sphenoid sinus.
IMPRESSION: No evidence of acute intracranial abnormality.

Minimal chronic small vessel ischemic changes within the cerebral
white matter.

Mild generalized cerebral and cerebellar volume loss.

## 2023-05-23 DIAGNOSIS — M545 Low back pain, unspecified: Secondary | ICD-10-CM | POA: Diagnosis not present

## 2023-05-26 DIAGNOSIS — M545 Low back pain, unspecified: Secondary | ICD-10-CM | POA: Diagnosis not present

## 2023-05-30 DIAGNOSIS — M545 Low back pain, unspecified: Secondary | ICD-10-CM | POA: Diagnosis not present

## 2023-06-03 NOTE — Progress Notes (Signed)
 Assessment/Plan:    idiopathic Parkinson's disease.  The patient has tremor, bradykinesia, rigidity and mild postural instability.             - Patient previously under the care of Dr. Rinda Cheers. Jeneane Miracle             -We discussed that it used to be thought that levodopa would increase risk of melanoma but now it is believed that Parkinsons itself likely increases risk of melanoma. he is to get regular skin checks.  Information to dermatologist was given.             -We talked about restarting his levodopa and what version to start with.  He previously had some nausea with immediate release levodopa and much higher dose he may have had some hallucinations with (although was taking tramadol around the same time).  We could use the extended release in many forms, although the newer forms are going to be much more costly.  He ultimately decided to start with the immediate release.  Titration schedule was given which will work him up to carbidopa/levodopa 25/100, 2 tablets at 10 AM/2 tablets at 2 PM and 1 tablet at 6 PM.             -refer to PT             -We discussed community resources in the area including patient support groups and community exercise programs for PD and pt education was provided to the patient.              - He and I discussed extensively regular daily schedule.  He is sleeping too late in the day and taking too long of naps.  We discussed last visit and reiterated today   2.  RBD             - He does not fall out of the bed.  He does have yelling out.  Bedroom safety discussed.   3.  Dysphagia             - Last modified barium swallow was in May, 2023 with evidence of mild oropharyngeal dysphagia.             - Repeat modified barium swallow is scheduled for tomorrow             - Patient follows with [facility] for this symptom in May, 2024.  He was scoped and treated for H. pylori.   4.  PBA             - Currently on citalopram.   -Nuedexta may be helpful  in the future   5.  Neurogenic Orthostatic Hypotension with history of syncope (that was vasovagal getting off the toilet)             -looks like he was on midodrine  years ago from [facility] but he doesn't recall that             - Now back on midodrine  2.5 mg three times per day with meals.  His blood pressure was pretty good today and he reports that his systolic was close to 160 when he woke up this morning.  However, he is still dizzy.  He was orthostatic in the office today, but I was reluctant to put him on more midodrine  because of the highs that he is having.  Once we start levodopa, we may be forced to do more midodrine , but we will see  how he does at that time.   6.  Sialorrhea             - Patient states that this is actually better right now.  We can consider Botox in the future, if needed.   Subjective:   Alex [name] was seen today in follow up for Parkinsons disease.  My previous records were reviewed prior to todays visit as well as outside records available to me.  Wife with patient and supplements hx.   I saw the patient for the first time about 4 weeks ago.  At that point in time, we decided to first focus on getting his blood pressure increased and we started him on midodrine , 2.5 mg 3 times per day with meals.  He reports that his BP is good but he is still dizzy.  He is currently not on any version of levodopa.  He had some nausea with immediate release levodopa, but had hallucinations with higher dose levodopa (3 tablets 3 times per day).  Waking up late in the day still and taking a lot of naps.    Current prescribed movement disorder medications: Midodrine , 2.5 mg 3 times per day with meals        PREVIOUS MEDICATIONS: amantadine ; carbidopa/levodopa (hallucinations at 3 tablets 3 times per day); selegeline; midodrine  (2022)  ALLERGIES:   Allergies  Allergen Reactions   Oxycodone     CURRENT MEDICATIONS:  Current Meds  Medication Sig   atorvastatin  (LIPITOR) 40 MG tablet Take 40 mg by mouth daily.   citalopram (CELEXA) 40 MG tablet Take by mouth.   folic acid  (FOLVITE ) 1 MG tablet folic acid  1 mg tablet   levothyroxine (SYNTHROID) 25 MCG tablet Take 25 mcg by mouth daily.   midodrine  (PROAMATINE ) 2.5 MG tablet Take 1 tablet (2.5 mg total) by mouth 3 (three) times daily with meals.   pantoprazole  (PROTONIX ) 40 MG tablet Take 1 tablet (40 mg total) by mouth 2 (two) times daily.   VITAMIN D, CHOLECALCIFEROL, PO Take by mouth.     Objective:   PHYSICAL EXAMINATION:    VITALS:   Vitals:   06/05/23 1045  BP: 134/86  Pulse: 70  Resp: 20  SpO2: 99%  Weight: 225 lb (102.1 kg)   Orthostatic VS for the past 72 hrs (Last 3 readings):  Orthostatic BP Patient Position BP Location Orthostatic Pulse  06/05/23 1131 100/60 Standing Left Arm 63  06/05/23 1130 116/60 Sitting Left Arm 63  06/05/23 1129 124/70 Supine Left Arm 64    GEN:  The patient appears stated age and is in NAD. HEENT:  Normocephalic, atraumatic.  The mucous membranes are moist. The superficial temporal arteries are without ropiness or tenderness. CV:  RRR Lungs:  CTAB Neck/HEME:  There are no carotid bruits bilaterally.  Neurological examination:  Orientation: The patient is alert and oriented x3. Cranial nerves: There is good facial symmetry with facial hypomimia. The speech is fluent and clear. Soft palate rises symmetrically and there is no tongue deviation. Hearing is intact to conversational tone. Sensation: Sensation is intact to light touch throughout Motor: Strength is at least antigravity x4.  Movement examination: Tone: There is mod to severe increased tone, L>R, UE>LE Abnormal movements: there is mild LUE rest tremor  Coordination:  There is mod decremation with RAM's, with any form of RAMS, including alternating supination and pronation of the forearm, hand opening and closing, finger taps, heel taps and toe taps, UE>>LE Gait and Station: The patient  pushes off to arise.  Stride length was actually fairly good.  He was mildly forward flexed.  He has slight decreased arm swing on the left.  I have reviewed and interpreted the following labs independently    Chemistry   No results found for: "NA", "K", "CL", "CO2", "BUN", "CREATININE", "GLU" No results found for: "CALCIUM", "ALKPHOS", "AST", "ALT", "BILITOT"     Lab Results  Component Value Date   WBC 9.6 07/01/2022   HGB 13.7 07/01/2022   HCT 41.5 07/01/2022   MCV 93.5 07/01/2022   PLT 310 07/01/2022    No results found for: "TSH"   Total time spent on today's visit was 41 minutes, including both face-to-face time and nonface-to-face time.  Time included that spent on review of records (prior notes available to me/labs/imaging if pertinent), discussing treatment and goals, answering patient's questions and coordinating care.  Cc:  Minus Amel, MD

## 2023-06-05 ENCOUNTER — Ambulatory Visit: Admitting: Neurology

## 2023-06-05 ENCOUNTER — Encounter: Payer: Self-pay | Admitting: Neurology

## 2023-06-05 VITALS — Resp 20 | Wt 225.0 lb

## 2023-06-05 DIAGNOSIS — G20A1 Parkinson's disease without dyskinesia, without mention of fluctuations: Secondary | ICD-10-CM

## 2023-06-05 DIAGNOSIS — R1319 Other dysphagia: Secondary | ICD-10-CM | POA: Diagnosis not present

## 2023-06-05 DIAGNOSIS — G903 Multi-system degeneration of the autonomic nervous system: Secondary | ICD-10-CM | POA: Diagnosis not present

## 2023-06-05 MED ORDER — CARBIDOPA-LEVODOPA 25-100 MG PO TABS: ORAL_TABLET | ORAL | Status: DC

## 2023-06-05 MED ORDER — MIDODRINE HCL 2.5 MG PO TABS: 2.5000 mg | ORAL_TABLET | Freq: Three times a day (TID) | ORAL | 1 refills | Status: DC

## 2023-06-05 NOTE — Patient Instructions (Addendum)
 [facility] and I sent your midodrine  there  Start Carbidopa Levodopa as follows: Take 1/2 tablet three times daily, at least 30 minutes before meals (approximately 10am/2pm/6pm), for one week Then take 1/2 tablet in the morning, 1/2 tablet in the afternoon, 1 tablet in the evening, at least 30 minutes before meals, for one week Then take 1/2 tablet in the morning, 1 tablet in the afternoon, 1 tablet in the evening, at least 30 minutes before meals, for one week Then take 1 tablet three times daily at 10am/2pm/6pm, at least 30 minutes before meals Then take 2 at 10 am, 1 at 2pm and 1 at 6pm x 1 week Then take 2 at 10 am, 2 at 2 pm and 1 at 6pm thereafter.   As a reminder, carbidopa/levodopa can be taken at the same time as a carbohydrate, but we like to have you take your pill either 30 minutes before a protein source or 1 hour after as protein can interfere with carbidopa/levodopa absorption.

## 2023-06-06 ENCOUNTER — Ambulatory Visit (HOSPITAL_COMMUNITY)
Admission: RE | Admit: 2023-06-06 | Discharge: 2023-06-06 | Disposition: A | Discharge status: Home / Self Care | Source: Ambulatory Visit | Attending: Neurology | Admitting: Neurology

## 2023-06-06 ENCOUNTER — Encounter (HOSPITAL_COMMUNITY): Payer: Self-pay | Admitting: Speech Pathology

## 2023-06-06 ENCOUNTER — Ambulatory Visit (HOSPITAL_COMMUNITY): Admitting: Speech Pathology

## 2023-06-06 DIAGNOSIS — R1312 Dysphagia, oropharyngeal phase: Secondary | ICD-10-CM | POA: Diagnosis not present

## 2023-06-06 DIAGNOSIS — K117 Disturbances of salivary secretion: Secondary | ICD-10-CM | POA: Diagnosis not present

## 2023-06-06 DIAGNOSIS — R1319 Other dysphagia: Secondary | ICD-10-CM | POA: Insufficient documentation

## 2023-06-06 DIAGNOSIS — R1310 Dysphagia, unspecified: Secondary | ICD-10-CM | POA: Diagnosis not present

## 2023-06-06 DIAGNOSIS — G20A1 Parkinson's disease without dyskinesia, without mention of fluctuations: Secondary | ICD-10-CM | POA: Diagnosis not present

## 2023-06-06 NOTE — Therapy (Signed)
 [facility] [place] [name] [address] [place], [place], [address] Phone: [phone]   Fax:  [phone]  Modified Barium Swallow  Patient Details  Name: Alex [name] MRN: [id number] Date of Birth: 1955/10/21 No data recorded  Encounter Date: 06/06/2023   End of Session - 06/06/23 1125     Visit Number 1    Number of Visits 1    Activity Tolerance Patient tolerated treatment well             HPI/PMH: HPI: Alex [name] is a 68 y/o male with pertinent hx of Parkinson's Disease, Sialorrhea, RBD, PBA and Dysphagia. Last MBSS completed 06/07/21 with recommendation for Regular/thin - meds whole with liquids. MBSS requested   Clinical Impression:  Pt presents with functional oropharyngeal swallowing. PT's swallowing today presents very similar to most recent MBSS completed in 2022. As visualized on the study in 2022 note brief stasis of the barium tablet in the valleculae that was cleared with multiple sips of thin liquids. Note occasional shallow flash penetration fo thin liquids that was cleared during the swallow without incident. All other textures and consistencies asessed (thin NTL, HTL, puree and regular) were unremarkable. Occasional trace valleculae residue visualized after the initial swallow that was cleared with a reflexive repeat swallow. Visualized prominent cricopharyngeus radiology PA present to confirm. Reviewed aspiration precautions and findings above with Patient and Alex [name]. Further recommend Pt be careful with regular textures that are problematic and that [name] continue to cut those foods into small pieces and adjust or avoid as needed. Communicated increased risk of aspiration given PD. There is no further ST therapy needed/indicated at this time. Thank you for this referral,  Factors that may increase risk of adverse event in presence of aspiration Alex [name] & Alex [name] 2021): No data  recorded  Recommendations/Plan: Swallowing Evaluation Recommendations Swallowing Evaluation Recommendations Recommendations: PO diet PO Diet Recommendation: Regular; Thin liquids (Level 0) Liquid Administration via: Cup; Straw Medication Administration: Whole meds with liquid Supervision: Patient able to self-feed Swallowing strategies  : Slow rate; Small bites/sips; Follow solids with liquids; Multiple dry swallows after each bite/sip Postural changes: Position pt fully upright for meals Oral care recommendations: Oral care BID (2x/day)    Treatment Plan Treatment Plan Treatment recommendations: Therapy as outlined in treatment plan below Follow-up recommendations: No SLP follow up     Recommendations Recommendations for follow up therapy are one component of a multi-disciplinary discharge planning process, led by the attending physician.  Recommendations may be updated based on patient status, additional functional criteria and insurance authorization.  Assessment: Orofacial Exam: Orofacial Exam Oral Cavity: Oral Hygiene: WFL Oral Cavity - Dentition: Adequate natural dentition Orofacial Anatomy: WFL Oral Motor/Sensory Function: WFL    Anatomy:  Anatomy: WFL   Boluses Administered: Boluses Administered Boluses Administered: Thin liquids (Level 0); Mildly thick liquids (Level 2, nectar thick); Moderately thick liquids (Level 3, honey thick); Puree; Solid     Oral Impairment Domain: Oral Impairment Domain Lip Closure: No labial escape Tongue control during bolus hold: Cohesive bolus between tongue to palatal seal Bolus preparation/mastication: Timely and efficient chewing and mashing Bolus transport/lingual motion: Delayed initiation of tongue motion (oral holding) Oral residue: Complete oral clearance Initiation of pharyngeal swallow : Posterior angle of the ramus; Posterior laryngeal surface of the epiglottis     Pharyngeal Impairment Domain: Pharyngeal  Impairment Domain Soft palate elevation: No bolus between soft palate (SP)/pharyngeal wall (PW) Laryngeal elevation: Partial superior movement of thyroid  cartilage/partial approximation of arytenoids  to epiglottic petiole Anterior hyoid excursion: Partial anterior movement Epiglottic movement: Partial inversion Laryngeal vestibule closure: Complete, no air/contrast in laryngeal vestibule; Incomplete, narrow column air/contrast in laryngeal vestibule Pharyngeal stripping wave : Present - diminished Pharyngeal contraction (A/P view only): N/A Pharyngoesophageal segment opening: Complete distension and complete duration, no obstruction of flow Tongue base retraction: No contrast between tongue base and posterior pharyngeal wall (PPW) Pharyngeal residue: Trace residue within or on pharyngeal structures Location of pharyngeal residue: Valleculae     Esophageal Impairment Domain: Esophageal Impairment Domain Esophageal clearance upright position: Complete clearance, esophageal coating    Pill: Pill Consistency administered: Thin liquids (Level 0) Thin liquids (Level 0): Alex [name]    Penetration/Aspiration Scale Score: No data recorded  Compensatory Strategies: No data recorded     General Information: Caregiver present: Yes   Diet Prior to this Study: Regular; Thin liquids (Level 0)    Temperature : Normal    Respiratory Status: WFL    Supplemental O2: None (Room air)    History of Recent Intubation: No   Behavior/Cognition: Alert; Cooperative; Pleasant mood  Self-Feeding Abilities: Able to self-feed  Baseline vocal quality/speech: Hypophonia/low volume  Volitional Cough: Able to elicit  Volitional Swallow: Able to elicit  No data recorded  Goal Planning: No data recorded No data recorded No data recorded No data recorded No data recorded  Pain: No data recorded  End of Session: Start Time:No data recorded Stop Time: No data recorded Time Calculation:No  data recorded Charges: No data recorded SLP visit diagnosis: SLP Visit Diagnosis: Dysphagia, oropharyngeal phase (R13.12)    Past Medical History:  Past Medical History:  Diagnosis Date   Anxiety    Arthritis    Dyspnea    GERD (gastroesophageal reflux disease)    High cholesterol    Hypertension    Parkinson disease (HCC)    2022/2023   Polycythemia, 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   Vertigo    Past Surgical History:  Past Surgical History:  Procedure Laterality Date   BACK SURGERY  2019   BIOPSY  06/14/2022   Procedure: BIOPSY;  Surgeon: Alex Greening, Alex [name];  Location: [facility];  Service: Endoscopy;;   ESOPHAGOGASTRODUODENOSCOPY (EGD) WITH PROPOFOL  N/A 06/14/2022   Procedure: ESOPHAGOGASTRODUODENOSCOPY (EGD) WITH PROPOFOL ;  Surgeon: Alex Greening, Alex [name];  Location: [facility];  Service: Endoscopy;  Laterality: N/A;  915AM, ASA 3   HERNIA REPAIR     NECK SURGERY  2022   SHOULDER SURGERY     Alex [name], Alex [name] Speech Language Pathologist  Alex [name] 06/06/2023, 11:26 AM Alex [name], Alex [name] 06/06/2023, 11:26 AM  [name] [address] [place], [place], [address] Phone: [phone]   Fax:  [phone]  Name: Alex [name] MRN: [id number] Date of Birth: February 19, 1955

## 2023-06-10 DIAGNOSIS — M5451 Vertebrogenic low back pain: Secondary | ICD-10-CM | POA: Diagnosis not present

## 2023-06-12 ENCOUNTER — Telehealth: Payer: Self-pay | Admitting: Neurology

## 2023-06-12 NOTE — Telephone Encounter (Signed)
 Patient needs a telephone number to where we sent him for PT

## 2023-06-13 NOTE — Telephone Encounter (Signed)
 [facility] and Phone: [phone].  Called patient and left a detailed message per DPR and provided information above. Left our contact information incase they had any questions or concerns.

## 2023-06-30 ENCOUNTER — Ambulatory Visit (HOSPITAL_COMMUNITY)

## 2023-06-30 ENCOUNTER — Telehealth: Payer: Self-pay | Admitting: Neurology

## 2023-06-30 NOTE — Telephone Encounter (Signed)
 Pt's wife called in wanting to see if there is anywhere that does physical therapy at home? She has trouble driving him sometimes due to her having vertigo.

## 2023-07-01 ENCOUNTER — Telehealth: Payer: Self-pay | Admitting: Neurology

## 2023-07-01 ENCOUNTER — Other Ambulatory Visit: Payer: Self-pay

## 2023-07-01 DIAGNOSIS — R1319 Other dysphagia: Secondary | ICD-10-CM

## 2023-07-01 DIAGNOSIS — G20A1 Parkinson's disease without dyskinesia, without mention of fluctuations: Secondary | ICD-10-CM

## 2023-07-01 NOTE — Telephone Encounter (Signed)
 Called wife and left message that home health care has beensent in for the patient

## 2023-07-01 NOTE — Telephone Encounter (Signed)
 Called patients wife and sent referral to [facility]

## 2023-07-01 NOTE — Telephone Encounter (Signed)
 Pt is returning a call to [facility]. This is regarding a referral from [facility]- regarding PT

## 2023-07-01 NOTE — Telephone Encounter (Signed)
 Pt. Wife xpln she called [facility], They need referral to [facility] from Dr Tat fax [phone] phone [phone] option 1 please call PTs wife when completed

## 2023-07-01 NOTE — Telephone Encounter (Signed)
 Sent to [facility]

## 2023-07-07 DIAGNOSIS — M25511 Pain in right shoulder: Secondary | ICD-10-CM | POA: Diagnosis not present

## 2023-07-07 DIAGNOSIS — G903 Multi-system degeneration of the autonomic nervous system: Secondary | ICD-10-CM | POA: Diagnosis not present

## 2023-07-07 DIAGNOSIS — G20A1 Parkinson's disease without dyskinesia, without mention of fluctuations: Secondary | ICD-10-CM | POA: Diagnosis not present

## 2023-07-07 DIAGNOSIS — G8929 Other chronic pain: Secondary | ICD-10-CM | POA: Diagnosis not present

## 2023-07-07 DIAGNOSIS — M25512 Pain in left shoulder: Secondary | ICD-10-CM | POA: Diagnosis not present

## 2023-07-07 DIAGNOSIS — R1319 Other dysphagia: Secondary | ICD-10-CM | POA: Diagnosis not present

## 2023-07-07 DIAGNOSIS — R1312 Dysphagia, oropharyngeal phase: Secondary | ICD-10-CM | POA: Diagnosis not present

## 2023-07-07 DIAGNOSIS — F419 Anxiety disorder, unspecified: Secondary | ICD-10-CM | POA: Diagnosis not present

## 2023-07-07 DIAGNOSIS — Z9181 History of falling: Secondary | ICD-10-CM | POA: Diagnosis not present

## 2023-07-09 ENCOUNTER — Other Ambulatory Visit (HOSPITAL_COMMUNITY): Payer: Self-pay | Admitting: Anesthesiology

## 2023-07-09 DIAGNOSIS — M25511 Pain in right shoulder: Secondary | ICD-10-CM | POA: Diagnosis not present

## 2023-07-09 DIAGNOSIS — M5459 Other low back pain: Secondary | ICD-10-CM | POA: Diagnosis not present

## 2023-07-09 DIAGNOSIS — M47816 Spondylosis without myelopathy or radiculopathy, lumbar region: Secondary | ICD-10-CM | POA: Diagnosis not present

## 2023-07-09 DIAGNOSIS — M25512 Pain in left shoulder: Secondary | ICD-10-CM | POA: Diagnosis not present

## 2023-07-10 DIAGNOSIS — Z6835 Body mass index (BMI) 35.0-35.9, adult: Secondary | ICD-10-CM | POA: Diagnosis not present

## 2023-07-10 DIAGNOSIS — H6121 Impacted cerumen, right ear: Secondary | ICD-10-CM | POA: Diagnosis not present

## 2023-07-10 DIAGNOSIS — E6609 Other obesity due to excess calories: Secondary | ICD-10-CM | POA: Diagnosis not present

## 2023-07-10 DIAGNOSIS — R42 Dizziness and giddiness: Secondary | ICD-10-CM | POA: Diagnosis not present

## 2023-07-11 DIAGNOSIS — M25512 Pain in left shoulder: Secondary | ICD-10-CM | POA: Diagnosis not present

## 2023-07-11 DIAGNOSIS — G8929 Other chronic pain: Secondary | ICD-10-CM | POA: Diagnosis not present

## 2023-07-11 DIAGNOSIS — G903 Multi-system degeneration of the autonomic nervous system: Secondary | ICD-10-CM | POA: Diagnosis not present

## 2023-07-11 DIAGNOSIS — F419 Anxiety disorder, unspecified: Secondary | ICD-10-CM | POA: Diagnosis not present

## 2023-07-11 DIAGNOSIS — G20A1 Parkinson's disease without dyskinesia, without mention of fluctuations: Secondary | ICD-10-CM | POA: Diagnosis not present

## 2023-07-11 DIAGNOSIS — Z9181 History of falling: Secondary | ICD-10-CM | POA: Diagnosis not present

## 2023-07-11 DIAGNOSIS — M25511 Pain in right shoulder: Secondary | ICD-10-CM | POA: Diagnosis not present

## 2023-07-11 DIAGNOSIS — R1312 Dysphagia, oropharyngeal phase: Secondary | ICD-10-CM | POA: Diagnosis not present

## 2023-07-11 DIAGNOSIS — R1319 Other dysphagia: Secondary | ICD-10-CM | POA: Diagnosis not present

## 2023-07-14 DIAGNOSIS — M25512 Pain in left shoulder: Secondary | ICD-10-CM | POA: Diagnosis not present

## 2023-07-14 DIAGNOSIS — R1319 Other dysphagia: Secondary | ICD-10-CM | POA: Diagnosis not present

## 2023-07-14 DIAGNOSIS — Z9181 History of falling: Secondary | ICD-10-CM | POA: Diagnosis not present

## 2023-07-14 DIAGNOSIS — M25511 Pain in right shoulder: Secondary | ICD-10-CM | POA: Diagnosis not present

## 2023-07-14 DIAGNOSIS — R1312 Dysphagia, oropharyngeal phase: Secondary | ICD-10-CM | POA: Diagnosis not present

## 2023-07-14 DIAGNOSIS — G20A1 Parkinson's disease without dyskinesia, without mention of fluctuations: Secondary | ICD-10-CM | POA: Diagnosis not present

## 2023-07-14 DIAGNOSIS — G903 Multi-system degeneration of the autonomic nervous system: Secondary | ICD-10-CM | POA: Diagnosis not present

## 2023-07-14 DIAGNOSIS — F419 Anxiety disorder, unspecified: Secondary | ICD-10-CM | POA: Diagnosis not present

## 2023-07-14 DIAGNOSIS — G8929 Other chronic pain: Secondary | ICD-10-CM | POA: Diagnosis not present

## 2023-07-15 ENCOUNTER — Ambulatory Visit (HOSPITAL_COMMUNITY)
Admission: RE | Admit: 2023-07-15 | Discharge: 2023-07-15 | Disposition: A | Discharge status: Home / Self Care | Source: Ambulatory Visit | Attending: Anesthesiology | Admitting: Anesthesiology

## 2023-07-15 DIAGNOSIS — M4316 Spondylolisthesis, lumbar region: Secondary | ICD-10-CM | POA: Diagnosis not present

## 2023-07-15 DIAGNOSIS — M5126 Other intervertebral disc displacement, lumbar region: Secondary | ICD-10-CM | POA: Diagnosis not present

## 2023-07-15 DIAGNOSIS — M47816 Spondylosis without myelopathy or radiculopathy, lumbar region: Secondary | ICD-10-CM | POA: Diagnosis not present

## 2023-07-15 DIAGNOSIS — M5459 Other low back pain: Secondary | ICD-10-CM | POA: Insufficient documentation

## 2023-07-15 DIAGNOSIS — M4187 Other forms of scoliosis, lumbosacral region: Secondary | ICD-10-CM | POA: Diagnosis not present

## 2023-07-17 DIAGNOSIS — M25512 Pain in left shoulder: Secondary | ICD-10-CM | POA: Diagnosis not present

## 2023-07-17 DIAGNOSIS — G8929 Other chronic pain: Secondary | ICD-10-CM | POA: Diagnosis not present

## 2023-07-17 DIAGNOSIS — Z9181 History of falling: Secondary | ICD-10-CM | POA: Diagnosis not present

## 2023-07-17 DIAGNOSIS — G20A1 Parkinson's disease without dyskinesia, without mention of fluctuations: Secondary | ICD-10-CM | POA: Diagnosis not present

## 2023-07-17 DIAGNOSIS — R1312 Dysphagia, oropharyngeal phase: Secondary | ICD-10-CM | POA: Diagnosis not present

## 2023-07-17 DIAGNOSIS — G903 Multi-system degeneration of the autonomic nervous system: Secondary | ICD-10-CM | POA: Diagnosis not present

## 2023-07-17 DIAGNOSIS — R1319 Other dysphagia: Secondary | ICD-10-CM | POA: Diagnosis not present

## 2023-07-17 DIAGNOSIS — M25511 Pain in right shoulder: Secondary | ICD-10-CM | POA: Diagnosis not present

## 2023-07-17 DIAGNOSIS — F419 Anxiety disorder, unspecified: Secondary | ICD-10-CM | POA: Diagnosis not present

## 2023-07-21 DIAGNOSIS — M25512 Pain in left shoulder: Secondary | ICD-10-CM | POA: Diagnosis not present

## 2023-07-21 DIAGNOSIS — M25511 Pain in right shoulder: Secondary | ICD-10-CM | POA: Diagnosis not present

## 2023-07-21 DIAGNOSIS — R1319 Other dysphagia: Secondary | ICD-10-CM | POA: Diagnosis not present

## 2023-07-21 DIAGNOSIS — Z9181 History of falling: Secondary | ICD-10-CM | POA: Diagnosis not present

## 2023-07-21 DIAGNOSIS — G903 Multi-system degeneration of the autonomic nervous system: Secondary | ICD-10-CM | POA: Diagnosis not present

## 2023-07-21 DIAGNOSIS — G20A1 Parkinson's disease without dyskinesia, without mention of fluctuations: Secondary | ICD-10-CM | POA: Diagnosis not present

## 2023-07-21 DIAGNOSIS — G8929 Other chronic pain: Secondary | ICD-10-CM | POA: Diagnosis not present

## 2023-07-21 DIAGNOSIS — R1312 Dysphagia, oropharyngeal phase: Secondary | ICD-10-CM | POA: Diagnosis not present

## 2023-07-21 DIAGNOSIS — F419 Anxiety disorder, unspecified: Secondary | ICD-10-CM | POA: Diagnosis not present

## 2023-07-25 DIAGNOSIS — M5416 Radiculopathy, lumbar region: Secondary | ICD-10-CM | POA: Insufficient documentation

## 2023-07-30 DIAGNOSIS — M47816 Spondylosis without myelopathy or radiculopathy, lumbar region: Secondary | ICD-10-CM | POA: Diagnosis not present

## 2023-07-30 DIAGNOSIS — M5459 Other low back pain: Secondary | ICD-10-CM | POA: Diagnosis not present

## 2023-07-30 DIAGNOSIS — M25512 Pain in left shoulder: Secondary | ICD-10-CM | POA: Diagnosis not present

## 2023-07-30 DIAGNOSIS — M25511 Pain in right shoulder: Secondary | ICD-10-CM | POA: Diagnosis not present

## 2023-07-31 DIAGNOSIS — G20A1 Parkinson's disease without dyskinesia, without mention of fluctuations: Secondary | ICD-10-CM | POA: Diagnosis not present

## 2023-07-31 DIAGNOSIS — G8929 Other chronic pain: Secondary | ICD-10-CM | POA: Diagnosis not present

## 2023-07-31 DIAGNOSIS — R1319 Other dysphagia: Secondary | ICD-10-CM | POA: Diagnosis not present

## 2023-07-31 DIAGNOSIS — R1312 Dysphagia, oropharyngeal phase: Secondary | ICD-10-CM | POA: Diagnosis not present

## 2023-07-31 DIAGNOSIS — Z9181 History of falling: Secondary | ICD-10-CM | POA: Diagnosis not present

## 2023-07-31 DIAGNOSIS — M25512 Pain in left shoulder: Secondary | ICD-10-CM | POA: Diagnosis not present

## 2023-07-31 DIAGNOSIS — F419 Anxiety disorder, unspecified: Secondary | ICD-10-CM | POA: Diagnosis not present

## 2023-07-31 DIAGNOSIS — M25511 Pain in right shoulder: Secondary | ICD-10-CM | POA: Diagnosis not present

## 2023-07-31 DIAGNOSIS — G903 Multi-system degeneration of the autonomic nervous system: Secondary | ICD-10-CM | POA: Diagnosis not present

## 2023-08-05 DIAGNOSIS — M25511 Pain in right shoulder: Secondary | ICD-10-CM | POA: Diagnosis not present

## 2023-08-05 DIAGNOSIS — G20A1 Parkinson's disease without dyskinesia, without mention of fluctuations: Secondary | ICD-10-CM | POA: Diagnosis not present

## 2023-08-05 DIAGNOSIS — R1319 Other dysphagia: Secondary | ICD-10-CM | POA: Diagnosis not present

## 2023-08-05 DIAGNOSIS — Z9181 History of falling: Secondary | ICD-10-CM | POA: Diagnosis not present

## 2023-08-05 DIAGNOSIS — G8929 Other chronic pain: Secondary | ICD-10-CM | POA: Diagnosis not present

## 2023-08-05 DIAGNOSIS — M25512 Pain in left shoulder: Secondary | ICD-10-CM | POA: Diagnosis not present

## 2023-08-05 DIAGNOSIS — G903 Multi-system degeneration of the autonomic nervous system: Secondary | ICD-10-CM | POA: Diagnosis not present

## 2023-08-05 DIAGNOSIS — R1312 Dysphagia, oropharyngeal phase: Secondary | ICD-10-CM | POA: Diagnosis not present

## 2023-08-05 DIAGNOSIS — F419 Anxiety disorder, unspecified: Secondary | ICD-10-CM | POA: Diagnosis not present

## 2023-08-08 DIAGNOSIS — R1312 Dysphagia, oropharyngeal phase: Secondary | ICD-10-CM | POA: Diagnosis not present

## 2023-08-08 DIAGNOSIS — G20A1 Parkinson's disease without dyskinesia, without mention of fluctuations: Secondary | ICD-10-CM | POA: Diagnosis not present

## 2023-08-08 DIAGNOSIS — G8929 Other chronic pain: Secondary | ICD-10-CM | POA: Diagnosis not present

## 2023-08-08 DIAGNOSIS — R1319 Other dysphagia: Secondary | ICD-10-CM | POA: Diagnosis not present

## 2023-08-08 DIAGNOSIS — G903 Multi-system degeneration of the autonomic nervous system: Secondary | ICD-10-CM | POA: Diagnosis not present

## 2023-08-08 DIAGNOSIS — Z9181 History of falling: Secondary | ICD-10-CM | POA: Diagnosis not present

## 2023-08-08 DIAGNOSIS — M25511 Pain in right shoulder: Secondary | ICD-10-CM | POA: Diagnosis not present

## 2023-08-08 DIAGNOSIS — F419 Anxiety disorder, unspecified: Secondary | ICD-10-CM | POA: Diagnosis not present

## 2023-08-08 DIAGNOSIS — M25512 Pain in left shoulder: Secondary | ICD-10-CM | POA: Diagnosis not present

## 2023-08-13 DIAGNOSIS — M5416 Radiculopathy, lumbar region: Secondary | ICD-10-CM | POA: Diagnosis not present

## 2023-08-15 DIAGNOSIS — Z9181 History of falling: Secondary | ICD-10-CM | POA: Diagnosis not present

## 2023-08-15 DIAGNOSIS — G8929 Other chronic pain: Secondary | ICD-10-CM | POA: Diagnosis not present

## 2023-08-15 DIAGNOSIS — R1312 Dysphagia, oropharyngeal phase: Secondary | ICD-10-CM | POA: Diagnosis not present

## 2023-08-15 DIAGNOSIS — G20A1 Parkinson's disease without dyskinesia, without mention of fluctuations: Secondary | ICD-10-CM | POA: Diagnosis not present

## 2023-08-15 DIAGNOSIS — R1319 Other dysphagia: Secondary | ICD-10-CM | POA: Diagnosis not present

## 2023-08-15 DIAGNOSIS — M25511 Pain in right shoulder: Secondary | ICD-10-CM | POA: Diagnosis not present

## 2023-08-15 DIAGNOSIS — F419 Anxiety disorder, unspecified: Secondary | ICD-10-CM | POA: Diagnosis not present

## 2023-08-15 DIAGNOSIS — M25512 Pain in left shoulder: Secondary | ICD-10-CM | POA: Diagnosis not present

## 2023-08-15 DIAGNOSIS — G903 Multi-system degeneration of the autonomic nervous system: Secondary | ICD-10-CM | POA: Diagnosis not present

## 2023-08-21 DIAGNOSIS — G903 Multi-system degeneration of the autonomic nervous system: Secondary | ICD-10-CM | POA: Diagnosis not present

## 2023-08-21 DIAGNOSIS — G8929 Other chronic pain: Secondary | ICD-10-CM | POA: Diagnosis not present

## 2023-08-21 DIAGNOSIS — Z9181 History of falling: Secondary | ICD-10-CM | POA: Diagnosis not present

## 2023-08-21 DIAGNOSIS — R1319 Other dysphagia: Secondary | ICD-10-CM | POA: Diagnosis not present

## 2023-08-21 DIAGNOSIS — G20A1 Parkinson's disease without dyskinesia, without mention of fluctuations: Secondary | ICD-10-CM | POA: Diagnosis not present

## 2023-08-21 DIAGNOSIS — M25511 Pain in right shoulder: Secondary | ICD-10-CM | POA: Diagnosis not present

## 2023-08-21 DIAGNOSIS — F419 Anxiety disorder, unspecified: Secondary | ICD-10-CM | POA: Diagnosis not present

## 2023-08-21 DIAGNOSIS — R1312 Dysphagia, oropharyngeal phase: Secondary | ICD-10-CM | POA: Diagnosis not present

## 2023-08-21 DIAGNOSIS — M25512 Pain in left shoulder: Secondary | ICD-10-CM | POA: Diagnosis not present

## 2023-08-25 DIAGNOSIS — F419 Anxiety disorder, unspecified: Secondary | ICD-10-CM | POA: Diagnosis not present

## 2023-08-25 DIAGNOSIS — R1312 Dysphagia, oropharyngeal phase: Secondary | ICD-10-CM | POA: Diagnosis not present

## 2023-08-25 DIAGNOSIS — M25511 Pain in right shoulder: Secondary | ICD-10-CM | POA: Diagnosis not present

## 2023-08-25 DIAGNOSIS — Z9181 History of falling: Secondary | ICD-10-CM | POA: Diagnosis not present

## 2023-08-25 DIAGNOSIS — M25512 Pain in left shoulder: Secondary | ICD-10-CM | POA: Diagnosis not present

## 2023-08-25 DIAGNOSIS — R1319 Other dysphagia: Secondary | ICD-10-CM | POA: Diagnosis not present

## 2023-08-25 DIAGNOSIS — G20A1 Parkinson's disease without dyskinesia, without mention of fluctuations: Secondary | ICD-10-CM | POA: Diagnosis not present

## 2023-08-25 DIAGNOSIS — G903 Multi-system degeneration of the autonomic nervous system: Secondary | ICD-10-CM | POA: Diagnosis not present

## 2023-08-25 DIAGNOSIS — G8929 Other chronic pain: Secondary | ICD-10-CM | POA: Diagnosis not present

## 2023-08-27 ENCOUNTER — Other Ambulatory Visit: Payer: Self-pay | Admitting: *Deleted

## 2023-09-01 ENCOUNTER — Telehealth: Payer: Self-pay | Admitting: Neurology

## 2023-09-01 ENCOUNTER — Other Ambulatory Visit: Payer: Self-pay

## 2023-09-01 MED ORDER — CARBIDOPA-LEVODOPA 25-100 MG PO TABS: ORAL_TABLET | ORAL | 0 refills | Status: DC

## 2023-09-01 NOTE — Telephone Encounter (Signed)
 Sent rx in to [facility]

## 2023-09-01 NOTE — Telephone Encounter (Signed)
 Pt called in this afternoon and he stated that he needs his carbidopa -levodopa  (SINEMET  IR) 25-100 MG tablet  to be filled by [facility] at at [phone]> Thanks

## 2023-09-04 DIAGNOSIS — R7309 Other abnormal glucose: Secondary | ICD-10-CM | POA: Diagnosis not present

## 2023-09-04 DIAGNOSIS — Z125 Encounter for screening for malignant neoplasm of prostate: Secondary | ICD-10-CM | POA: Diagnosis not present

## 2023-09-04 DIAGNOSIS — Z0001 Encounter for general adult medical examination with abnormal findings: Secondary | ICD-10-CM | POA: Diagnosis not present

## 2023-09-04 DIAGNOSIS — F329 Major depressive disorder, single episode, unspecified: Secondary | ICD-10-CM | POA: Diagnosis not present

## 2023-09-04 DIAGNOSIS — Z6835 Body mass index (BMI) 35.0-35.9, adult: Secondary | ICD-10-CM | POA: Diagnosis not present

## 2023-09-04 DIAGNOSIS — E039 Hypothyroidism, unspecified: Secondary | ICD-10-CM | POA: Diagnosis not present

## 2023-09-04 DIAGNOSIS — E782 Mixed hyperlipidemia: Secondary | ICD-10-CM | POA: Diagnosis not present

## 2023-09-04 DIAGNOSIS — G20A1 Parkinson's disease without dyskinesia, without mention of fluctuations: Secondary | ICD-10-CM | POA: Diagnosis not present

## 2023-09-04 DIAGNOSIS — Z1331 Encounter for screening for depression: Secondary | ICD-10-CM | POA: Diagnosis not present

## 2023-09-04 DIAGNOSIS — E6609 Other obesity due to excess calories: Secondary | ICD-10-CM | POA: Diagnosis not present

## 2023-10-06 ENCOUNTER — Other Ambulatory Visit: Payer: Self-pay | Admitting: Internal Medicine

## 2023-10-31 NOTE — Progress Notes (Unsigned)
 Assessment/Plan:    idiopathic Parkinson's disease.  The patient has tremor, bradykinesia, rigidity and mild postural instability.             - Patient previously under the care of Dr. Dairl. Alex [name]             -We discussed that it used to be thought that levodopa  would increase risk of melanoma but now it is believed that Parkinsons itself likely increases risk of melanoma. he is to get regular skin checks.  Information to dermatologist was given.             -continue carbidopa /levodopa  25/100, 2 tablets at 10 AM/2 tablets at 2 PM and 1 tablet at 6 PM.  He is still a bit underdosed but not like previously, and I saw him when he was just due for medication.  Hope to see him next time when at peak dose   2.  RBD             - He does not fall out of the bed.  He does have yelling out.  Bedroom safety discussed.   3.  Dysphagia             - Most recent modified barium swallow in May, 2025 was unremarkable, and regular diet with thin liquid was recommended.  GI issues may play a role here as opposed to primary parkinsonism issues.             - Patient follows with [facility] for this symptom in May, 2024.  He was scoped and treated for H. pylori.   4.  PBA             - Currently on citalopram.  Pcp tried to change to paxil but it caused hallucinations so back on paxil. -Nuedexta may be helpful in the future   5.  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  Discussed concept of permissive HTN and when to be worried with elevated BPs   6.  Sialorrhea             - Patient states that this is actually better right now.  We can consider Botox in the future, if needed.   Subjective:   Alex [name] was seen today in follow-up for Parkinson's disease. This patient is accompanied in the office by his spouse who supplements the history.  Last visit, we decided  to cautiously restart levodopa , which was previously held due to dizziness.  When i last saw him, he still had some dizziness, but reported that his blood pressure was much better since being on the midodrine .  We did start him back on levodopa  last visit, titrating slowly up to carbidopa /levodopa  25/100, 2 tablets at 10 AM/2 tablets at 2 PM and 1 tablet at 6 PM.  He was also referred to physical therapy.  We talked about his sleep schedule as well, as he was sleeping too late in the day and taking lengthy naps.  He is still taking long naps but doing better with his at home exercises/PT.  He had a modified barium swallow since last visit and this demonstrated similar findings to his previous study of 2022, although they did not really mention his study in 2023.  Functional oropharyngeal swallow was demonstrated.  Regular diet with thin liquid recommended.  He tells me today that he stopped his midodrine  because his BP was up to a max of 150 SBP.  He states that the RN told him to go off of it.   He was given paroxetine and that caused hallucinations but he stopped that  few  nights ago and it stopped.  He is back on the citalopram and ok the last few days.  Current prescribed movement disorder medications: Midodrine , 2.5 mg 3 times per day with meals Carbidopa /levodopa  25/100, 2 tablets at 10 AM/2 tablets at 2 PM and 1 tablet at 6 PM   PREVIOUS MEDICATIONS: amantadine ; carbidopa /levodopa  (hallucinations at 3 tablets 3 times per day); selegeline; midodrine  (2022); paroxetine (hallucinations)  ALLERGIES:   Allergies  Allergen Reactions   Oxycodone     CURRENT MEDICATIONS:  Current Meds  Medication Sig   atorvastatin (LIPITOR) 40 MG tablet Take 40 mg by mouth daily.   carbidopa -levodopa  (SINEMET  IR) 25-100 MG tablet Take 2 at 10am, 2 at 1 pm, 1 at 6pm   citalopram (CELEXA) 40 MG tablet Take by mouth.   folic acid  (FOLVITE ) 1 MG tablet folic acid  1 mg tablet   levothyroxine (SYNTHROID) 25 MCG  tablet Take 25 mcg by mouth daily.   pantoprazole  (PROTONIX ) 40 MG tablet TAKE 1 TABLET(40 MG) BY MOUTH TWICE DAILY   VITAMIN D, CHOLECALCIFEROL, PO Take by mouth.     Objective:   PHYSICAL EXAMINATION:    VITALS:   Vitals:   11/03/23 1316 11/03/23 1356  BP: 120/86 100/80  Pulse: (!) 57   SpO2: 99%   Weight: 233 lb 9.6 oz (106 kg)     Orthostatic VS for the past 72 hrs (Last 3 readings):  Patient Position  11/03/23 1356 Standing     GEN:  The patient appears stated age and is in NAD. HEENT:  Normocephalic, atraumatic.  The mucous membranes are moist. The superficial temporal arteries are without ropiness or tenderness. CV:  RRR Lungs:  CTAB Neck/HEME:  There are no carotid bruits bilaterally.  Neurological examination:  Orientation: The patient is alert and oriented x3. Cranial nerves: There is good facial symmetry with facial hypomimia. The speech is fluent and clear. Soft palate rises symmetrically and there is no tongue deviation. Hearing is intact to conversational tone. Sensation: Sensation is intact to light touch throughout Motor: Strength is at least antigravity x4.  Movement examination: Tone: There is mod increased tone in the ue, R>L (improved) Abnormal movements: there is no tremor today Coordination:  There is mild decremation with RAM's, with any form of RAMS, including alternating supination and pronation of the forearm, hand opening and closing, finger taps, heel taps and toe taps b/l.  This is improved Gait and Station: The patient pushes off to arise.  Stride length was actually fairly good.  He was mildly forward flexed.  He has slight decreased arm swing on the left.  I have reviewed and interpreted the following labs independently    Lab Results  Component Value Date   WBC 9.6 07/01/2022   HGB 13.7 07/01/2022   HCT 41.5 07/01/2022   MCV 93.5 07/01/2022   PLT 310 07/01/2022    No results found for: TSH   Total time spent on today's  visit was 40 minutes, including both face-to-face time and nonface-to-face time.  Time included that spent on review of records (prior notes available to me/labs/imaging if pertinent), discussing treatment and goals, answering patient's questions and coordinating care.  Cc:  Marvine Rush, MD

## 2023-11-03 ENCOUNTER — Ambulatory Visit: Admitting: Neurology

## 2023-11-03 ENCOUNTER — Encounter: Payer: Self-pay | Admitting: Neurology

## 2023-11-03 VITALS — BP 100/80 | HR 57 | Wt 233.6 lb

## 2023-11-03 DIAGNOSIS — G20A1 Parkinson's disease without dyskinesia, without mention of fluctuations: Secondary | ICD-10-CM | POA: Diagnosis not present

## 2023-11-03 DIAGNOSIS — R1319 Other dysphagia: Secondary | ICD-10-CM

## 2023-11-03 DIAGNOSIS — G903 Multi-system degeneration of the autonomic nervous system: Secondary | ICD-10-CM | POA: Diagnosis not present

## 2023-11-03 MED ORDER — MIDODRINE HCL 2.5 MG PO TABS
2.5000 mg | ORAL_TABLET | Freq: Three times a day (TID) | ORAL | 1 refills | Status: AC
Start: 2023-11-03 — End: ?

## 2023-11-03 NOTE — Patient Instructions (Signed)
 Restart midodrine  2.5 mg three times per day with meals Increase water intake to 60 oz per day You can increase your salt intake Keep exercising!  Proud of you!  The physicians and staff at [facility] are committed to providing excellent care. You may receive a survey requesting feedback about your experience at our office. We strive to receive very good responses to the survey questions. If you feel that your experience would prevent you from giving the office a very good  response, please contact our office to try to remedy the situation. We may be reached at [phone]. Thank you for taking the time out of your busy day to complete the survey.

## 2023-12-03 ENCOUNTER — Other Ambulatory Visit: Payer: Self-pay | Admitting: Neurology

## 2024-01-23 ENCOUNTER — Ambulatory Visit: Payer: Self-pay

## 2024-01-23 VITALS — BP 144/86 | HR 64 | Ht 69.0 in | Wt 234.1 lb

## 2024-01-23 DIAGNOSIS — H6121 Impacted cerumen, right ear: Secondary | ICD-10-CM

## 2024-01-23 DIAGNOSIS — G903 Multi-system degeneration of the autonomic nervous system: Secondary | ICD-10-CM | POA: Diagnosis not present

## 2024-01-23 DIAGNOSIS — K5900 Constipation, unspecified: Secondary | ICD-10-CM | POA: Diagnosis not present

## 2024-01-23 DIAGNOSIS — R1319 Other dysphagia: Secondary | ICD-10-CM | POA: Insufficient documentation

## 2024-01-23 NOTE — Progress Notes (Unsigned)
 "  New Patient Office Visit  Subjective    Patient ID: Alex [name], male    DOB: Jan 19, 1955  Age: 69 y.o. MRN: [id number]  CC:  Chief Complaint  Patient presents with   Establish Care    Pt needs his ears checked and just overall here to establish care     HPI Alex [name] presents to establish care Discussed the use of AI scribe software for clinical note transcription with the patient, who gave verbal consent to proceed.  History of Present Illness    Alex [name] is a 69 year old male with Parkinson's disease who presents for establishment of care and ear cleaning. He is accompanied by his wife.  Cerumen impaction and associated symptoms - Blockage in right ear resulting in dizziness and lightheadedness - Frequent earwax buildup requiring cleaning every couple of months - Home ear cleaning performed by wife using hydrogen peroxide and ear wax removal drops - Persistent blockage in right ear despite home interventions  Parkinson's disease management - Diagnosed with Parkinson's disease - Undergoing therapy at home - Performs exercises demonstrated by pulmonary disease specialist three to four times per week - Takes levodopa  eight times daily  Anxiety and associated constipation - History of anxiety since twenties - Takes Paxil for anxiety, finds it difficult to discontinue - Constipation possibly related to Paxil use - Miralax tried but found ineffective and not used daily - Uses magnesium citrate to aid with bowel movements  Blood pressure management - Takes midodrine  five times daily for blood pressure control - Previously prescribed midodrine  four times daily  Other chronic medication use - Takes atorvastatin for hyperlipidemia - Takes levothyroxine for hypothyroidism - Takes mesalazine  Musculoskeletal symptoms - History of two neck surgeries - Arm problems possibly related to arthritis  Peripheral edema and discoloration - Occasional swelling in  legs - Legs sometimes appear purple - No significant swelling  Hydration preferences - Drinks sugar-free Kool-Aid and water - Difficulty drinking plain water      Outpatient Encounter Medications as of 01/23/2024  Medication Sig   atorvastatin (LIPITOR) 40 MG tablet Take 40 mg by mouth daily.   carbidopa -levodopa  (SINEMET  IR) 25-100 MG tablet TAKE 2 TABLETS AT 10AM, 2 TABLETS AT 1PM AND 1 TABLET AT 6PM   citalopram (CELEXA) 40 MG tablet Take by mouth.   levothyroxine (SYNTHROID) 25 MCG tablet Take 50 mcg by mouth daily before breakfast.   meclizine (ANTIVERT) 25 MG tablet Take 25 mg by mouth as needed for dizziness.   meloxicam (MOBIC) 15 MG tablet Take 40 mg by mouth daily.   midodrine  (PROAMATINE ) 2.5 MG tablet Take 1 tablet (2.5 mg total) by mouth 3 (three) times daily with meals.   polyethylene glycol powder (GLYCOLAX/MIRALAX) 17 GM/SCOOP powder Take 17 g by mouth.   VITAMIN D, CHOLECALCIFEROL, PO Take by mouth.   [DISCONTINUED] folic acid  (FOLVITE ) 1 MG tablet folic acid  1 mg tablet (Patient not taking: Reported on 01/23/2024)   [DISCONTINUED] pantoprazole  (PROTONIX ) 40 MG tablet TAKE 1 TABLET(40 MG) BY MOUTH TWICE DAILY (Patient not taking: Reported on 01/23/2024)   No facility-administered encounter medications on file as of 01/23/2024.    Past Medical History:  Diagnosis Date   Anxiety    Arthritis    Dyspnea    GERD (gastroesophageal reflux disease)    High cholesterol    Hypertension    Parkinson disease (HCC)    2022/2023   Polycythemia, 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   Vertigo     Past Surgical  History:  Procedure Laterality Date   BACK SURGERY  2019   BIOPSY  06/14/2022   Procedure: BIOPSY;  Surgeon: Cindie Carlin POUR, DO;  Location: [facility];  Service: Endoscopy;;   ESOPHAGOGASTRODUODENOSCOPY (EGD) WITH PROPOFOL  N/A 06/14/2022   Procedure: ESOPHAGOGASTRODUODENOSCOPY (EGD) WITH PROPOFOL ;  Surgeon: Cindie Carlin POUR, DO;  Location: [facility];  Service: Endoscopy;   Laterality: N/A;  915AM, ASA 3   HERNIA REPAIR     NECK SURGERY  2022   SHOULDER SURGERY      Family History  Problem Relation Age of Onset   Tremor Paternal Grandfather     Social History   Socioeconomic History   Marital status: Married    Spouse name: Not on file   Number of children: Not on file   Years of education: Not on file   Highest education level: GED or equivalent  Occupational History   Occupation: disabled    Comment: back issues per pt  Tobacco Use   Smoking status: Never   Smokeless tobacco: Never  Substance and Sexual Activity   Alcohol use: Not Currently   Drug use: Not Currently   Sexual activity: Yes  Other Topics Concern   Not on file  Social History Narrative   Right handed    Social Drivers of Health   Tobacco Use: Low Risk (01/23/2024)   Patient History    Smoking Tobacco Use: Never    Smokeless Tobacco Use: Never    Passive Exposure: Not on file  Financial Resource Strain: Low Risk (01/19/2024)   Overall Financial Resource Strain (CARDIA)    Difficulty of Paying Living Expenses: Not hard at all  Food Insecurity: No Food Insecurity (01/19/2024)   Epic    Worried About Radiation Protection Practitioner of Food in the Last Year: Never true    Ran Out of Food in the Last Year: Never true  Transportation Needs: No Transportation Needs (01/19/2024)   Epic    Lack of Transportation (Medical): No    Lack of Transportation (Non-Medical): No  Physical Activity: Insufficiently Active (01/19/2024)   Exercise Vital Sign    Days of Exercise per Week: 3 days    Minutes of Exercise per Session: 10 min  Stress: No Stress Concern Present (01/19/2024)   [employer] of Occupational Health - Occupational Stress Questionnaire    Feeling of Stress: Not at all  Social Connections: Moderately Integrated (01/19/2024)   Social Connection and Isolation Panel    Frequency of Communication with Friends and Family: Twice a week    Frequency of Social Gatherings with Friends and Family:  Once a week    Attends Religious Services: 1 to 4 times per year    Active Member of [employer] or Organizations: No    Attends Engineer, Structural: Not on file    Marital Status: Married  Intimate Partner Violence: Unknown (04/17/2021)   Received from [facility]   HITS    Physically Hurt: Not on file    Insult or Talk Down To: Not on file    Threaten Physical Harm: Not on file    Scream or Curse: Not on file  Depression (EYV7-0): Not on file  Alcohol Screen: Not on file  Housing: Low Risk (01/19/2024)   Epic    Unable to Pay for Housing in the Last Year: No    Number of Times Moved in the Last Year: 0    Homeless in the Last Year: No  Utilities: Not on file  Health  Literacy: Not on file    ROS      Objective    BP (!) 144/86   Pulse 64   Ht 5' 9 (1.753 m)   Wt 234 lb 1.9 oz (106.2 kg)   SpO2 95%   BMI 34.57 kg/m   Physical Exam Vitals and nursing note reviewed.  Constitutional:      Appearance: Normal appearance.  HENT:     Head: Normocephalic.     Right Ear: Tympanic membrane, ear canal and external ear normal.     Left Ear: Tympanic membrane, ear canal and external ear normal.     Nose: Nose normal.     Mouth/Throat:     Mouth: Mucous membranes are moist.     Pharynx: Oropharynx is clear.  Eyes:     Extraocular Movements: Extraocular movements intact.     Pupils: Pupils are equal, round, and reactive to light.  Cardiovascular:     Rate and Rhythm: Normal rate and regular rhythm.  Pulmonary:     Effort: Pulmonary effort is normal.     Breath sounds: Normal breath sounds.  Abdominal:     General: Abdomen is flat. Bowel sounds are normal.     Palpations: Abdomen is soft.  Musculoskeletal:        General: Normal range of motion.     Cervical back: Normal range of motion and neck supple.  Skin:    General: Skin is warm and dry.  Neurological:     Mental Status: He is alert and oriented to person, place, and time.  Psychiatric:        Mood and  Affect: Mood normal.        Thought Content: Thought content normal.         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 Neurogenic orthostatic hypotension managed with midodrine . Dizziness possibly related to ear issues. - Continue levodopa  as prescribed by neurologist. - Continue midodrine  five times daily.        Other   Constipation   Chronic constipation with ineffective Miralax use. Discussed magnesium types and effects. - Consider using Colace as a stool softener. - Continue magnesium as needed for constipation.      Other Visit Diagnoses       Impacted cerumen of right ear       Right ear impacted with cerumen  - Cerumen successsfully removed with irrigation.  - Advised use of hydrogen peroxide drops weekly to soften cerumen.   Relevant Orders   Ear Lavage      Return in about 6 months (around 07/22/2024) for chronic follow-up with PCP.   Leita Longs, FNP   "

## 2024-01-25 DIAGNOSIS — K5900 Constipation, unspecified: Secondary | ICD-10-CM | POA: Insufficient documentation

## 2024-01-25 NOTE — Assessment & Plan Note (Signed)
 Parkinson disease with neurogenic orthostatic hypotension Parkinson disease managed with levodopa . Neurogenic orthostatic hypotension managed with midodrine . Dizziness possibly related to ear issues. - Continue levodopa  as prescribed by neurologist. - Continue midodrine  five times daily.

## 2024-01-25 NOTE — Assessment & Plan Note (Signed)
 Chronic constipation with ineffective Miralax use. Discussed magnesium types and effects. - Consider using Colace as a stool softener. - Continue magnesium as needed for constipation.

## 2024-04-19 ENCOUNTER — Ambulatory Visit: Payer: Self-pay

## 2024-05-04 ENCOUNTER — Ambulatory Visit: Admitting: Neurology

## 2024-07-22 ENCOUNTER — Ambulatory Visit: Payer: Self-pay
